# Patient Record
Sex: Female | Born: 1995 | Race: Black or African American | Hispanic: No | Marital: Single | Smoking: Never smoker
Health system: Southern US, Community
[De-identification: ages and names within clinical notes are randomized; demographics above are authoritative.]

## PROBLEM LIST (undated history)

## (undated) DIAGNOSIS — T7840XA Allergy, unspecified, initial encounter: Secondary | ICD-10-CM

## (undated) DIAGNOSIS — D649 Anemia, unspecified: Secondary | ICD-10-CM

## (undated) DIAGNOSIS — F32A Depression, unspecified: Secondary | ICD-10-CM

## (undated) DIAGNOSIS — F419 Anxiety disorder, unspecified: Secondary | ICD-10-CM

## (undated) HISTORY — DX: Depression, unspecified: F32.A

## (undated) HISTORY — DX: Anxiety disorder, unspecified: F41.9

## (undated) HISTORY — DX: Anemia, unspecified: D64.9

## (undated) HISTORY — DX: Allergy, unspecified, initial encounter: T78.40XA

---

## 2017-01-12 ENCOUNTER — Encounter: Payer: Self-pay | Admitting: Physician Assistant

## 2017-01-12 ENCOUNTER — Ambulatory Visit (INDEPENDENT_AMBULATORY_CARE_PROVIDER_SITE_OTHER): Payer: 59 | Admitting: Physician Assistant

## 2017-01-12 VITALS — BP 105/73 | HR 88 | Temp 98.9°F | Resp 17 | Ht 66.5 in | Wt 157.0 lb

## 2017-01-12 DIAGNOSIS — F909 Attention-deficit hyperactivity disorder, unspecified type: Secondary | ICD-10-CM

## 2017-01-12 DIAGNOSIS — Z0289 Encounter for other administrative examinations: Secondary | ICD-10-CM | POA: Diagnosis not present

## 2017-01-12 NOTE — Progress Notes (Signed)
   Aubery Date  MRN: [id number] DOB: Feb 11, 1996  PCP: No PCP Per Patient  Subjective:  Pt is a 21 year old female who presents to clinic for completion of forms. 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 Her PCPis in [place]. She also sees a behavioral health, Dr Saint Lucia Vizarra-Villongco in [place]. She is here today for a medical form to be filled out to allow her to bring her emotional support dog with her on an airplane. She has had this form filled out previously by her PCP, however she cannot make it to [place] for this form to be filled out. She reports she is lonely and her dog makes her feel better.  H/o ADHD - takes Adderall  qd. Controlled.  Denies SI, [place].   Review of Systems  Psychiatric/Behavioral: Positive for decreased concentration and dysphoric mood. Negative for agitation, behavioral problems, self-injury and suicidal ideas. The patient is not nervous/anxious.     There are no active problems to display for this patient.   No current outpatient prescriptions on file prior to visit.   No current facility-administered medications on file prior to visit.     No Known Allergies   Objective:  BP 105/73 (BP Location: Right Arm, Patient Position: Sitting, Cuff Size: Normal)   Pulse 88   Temp 98.9 F (37.2 C) (Oral)   Resp 17   Ht 5' 6.5" (1.689 m)   Wt 157 lb (71.2 kg)   LMP 01/11/2017   SpO2 100%   BMI 24.96 kg/m   Physical Exam  Constitutional: She is oriented to person, place, and time and well-developed, well-nourished, and in no distress. No distress.  Cardiovascular: Normal rate, regular rhythm and normal heart sounds.   Neurological: She is alert and oriented to person, place, and time. GCS score is 15.  Skin: Skin is warm and dry.  Psychiatric: Mood, memory, affect and judgment normal.  Vitals reviewed.   Assessment and Plan :  1. Encounter for completion of form with patient 2. Attention deficit hyperactivity disorder  (ADHD), unspecified ADHD type - This is a new pt here for forms to be filled out to allow her an emotional support animal on an airplane. Forms filled out for pt. Advised pt to have her medical records faxed to PCP. Or have her PCP fill them out next time, as this is who treats her ADHD.  She understands and agrees.    Marco Collie, PA-C  Primary Care at [facility] 01/12/2017 2:03 PM

## 2017-01-12 NOTE — Patient Instructions (Addendum)
Thank you for coming in today. I hope you feel we met your needs.  Feel free to call [facility] if you have any questions or further requests.  Please consider signing up for MyChart if you do not already have it, as this is a great way to communicate with me.  Best,  Whitney McVey, PA-C     IF you received an x-ray today, you will receive an invoice from [facility]. Please contact [facility] at [phone] with questions or concerns regarding your invoice.   IF you received labwork today, you will receive an invoice from [place]. Please contact [facility] at [phone] with questions or concerns regarding your invoice.   Our billing staff will not be able to assist you with questions regarding bills from these companies.  You will be contacted with the lab results as soon as they are available. The fastest way to get your results is to activate your My Chart account. Instructions are located on the last page of this paperwork. If you have not heard from [place] regarding the results in 2 weeks, please contact this office.

## 2017-12-21 ENCOUNTER — Encounter: Payer: Self-pay | Admitting: Physician Assistant

## 2017-12-21 ENCOUNTER — Ambulatory Visit: Payer: No Typology Code available for payment source | Admitting: Physician Assistant

## 2017-12-21 VITALS — BP 102/68 | HR 86 | Temp 98.1°F | Resp 17 | Ht 66.5 in | Wt 146.0 lb

## 2017-12-21 DIAGNOSIS — Z113 Encounter for screening for infections with a predominantly sexual mode of transmission: Secondary | ICD-10-CM

## 2017-12-21 DIAGNOSIS — J029 Acute pharyngitis, unspecified: Secondary | ICD-10-CM

## 2017-12-21 LAB — POCT RAPID STREP A (OFFICE): Rapid Strep A Screen: NEGATIVE

## 2017-12-21 NOTE — Progress Notes (Signed)
Sue Allen  MRN: [id number] DOB: [birth year]  PCP: Patient, No Pcp Per  Subjective:  Pt is a 22 year old female who presents to clinic for sore throat x 3 days. She went to [facility], tested negative for flu and rapid strep. She was tested for gonorrhea of the throat.  "I am very sexually active".    Also c/o cough, sneezing and runny nose. She had a fever of 102 yesterday.  Today she feels better as she has been taking ibuprofen.   She would like routine screening for STD's. Denies vaginal or urinary symptoms, back pain, abdomdinal pain.   Review of Systems  Constitutional: Positive for fever. Negative for chills, diaphoresis and fatigue.  HENT: Positive for sneezing and sore throat. Negative for congestion, postnasal drip, rhinorrhea, sinus pressure and sinus pain.   Respiratory: Positive for cough. Negative for shortness of breath and wheezing.   Cardiovascular: Negative for chest pain and palpitations.  Gastrointestinal: Negative for abdominal pain, diarrhea, nausea and vomiting.  Genitourinary: Negative for decreased urine volume, difficulty urinating, dysuria, enuresis, flank pain, frequency, hematuria, urgency and vaginal discharge.  Musculoskeletal: Negative for back pain.  Neurological: Negative for dizziness, weakness, light-headedness and headaches.  Psychiatric/Behavioral: Negative for sleep disturbance.    There are no active problems to display for this patient.   Current Outpatient Medications on File Prior to Visit  Medication Sig Dispense Refill  . amphetamine-dextroamphetamine (ADDERALL XR) 20 MG 24 hr capsule Take 20 mg by mouth daily.    Marland Kitchen. ibuprofen (ADVIL,MOTRIN) 800 MG tablet Take 800 mg by mouth every 8 (eight) hours as needed.     No current facility-administered medications on file prior to visit.     No Known Allergies   Objective:  BP 102/68   Pulse 86   Temp 98.1 F (36.7 C) (Oral)   Resp 17   Ht 5' 6.5" (1.689 m)   Wt  146 lb (66.2 kg)   LMP 12/08/2017 (Approximate)   SpO2 98%   BMI 23.21 kg/m   Physical Exam  Constitutional: She is oriented to person, place, and time and well-developed, well-nourished, and in no distress. No distress.  HENT:  Right Ear: Tympanic membrane normal.  Left Ear: Tympanic membrane normal.  Nose: Mucosal edema present. No rhinorrhea. Right sinus exhibits no maxillary sinus tenderness and no frontal sinus tenderness. Left sinus exhibits no maxillary sinus tenderness and no frontal sinus tenderness.  Mouth/Throat: Mucous membranes are normal. Oropharyngeal exudate present. No posterior oropharyngeal edema or posterior oropharyngeal erythema.  Cardiovascular: Normal rate, regular rhythm and normal heart sounds.  Pulmonary/Chest: Effort normal and breath sounds normal. No respiratory distress. She has no wheezes. She has no rales.  Neurological: She is alert and oriented to person, place, and time. GCS score is 15.  Skin: Skin is warm and dry.  Psychiatric: Mood, memory, affect and judgment normal.  Vitals reviewed.  Results for orders placed or performed in visit on 12/21/17  POCT rapid strep A  Result Value Ref Range   Rapid Strep A Screen Negative Negative    Assessment and Plan :  1. Sore throat - POCT rapid strep A - Culture, Group A Strep - pt presents for sore throat x 3 days. Vitals are stable. Recently seen at [employer]. Rapid strep is negative today. Culture is pending. Will contact with results.  2. Screen for STD (sexually transmitted disease) - Chlamydia/Gonococcus/Trichomonas, NAA; Future - HIV antibody - RPR - HSV(herpes simplex vrs)  1+2 ab-IgG - She requests routine STD screening. She is asymptomatic.   Marco Collie, PA-C  Primary Care at [facility] 12/21/2017 9:24 AM

## 2017-12-21 NOTE — Patient Instructions (Addendum)
Your rapid strep is negative. We will contact you with the results of your strep culture.  Please come back to provide urine sample for STD screening. DO NOT URINATE FOR AT LEAST 2 hours prior to your sample.   Antibiotics Aren't Always The Answer  CDC Urges Public To Be Antibiotics Aware The Centers for Disease Control and Prevention (CDC) encourages patients and families to Be Antibiotics Aware by learning about safe antibiotic use. Each year in the [place], at least 2 million people get infected with antibiotic-resistant bacteria. At least 23,000 die as a result. Antibiotic resistance, one of the most urgent threats to the public's health, occurs when bacteria develop the ability to defeat the drugs designed to kill them.  What Do Antibiotics Treat? When you get a prescription for antibiotics, follow your doctor's instructions carefully.  Antibiotics are critical tools for treating a number of common infections, such as pneumonia, and for life-threatening conditions including sepsis. Antibiotics are only needed for treating certain infections caused by bacteria.  What Don't Antibiotics Treat? Antibiotics do not work on viruses, such as colds and flu, or runny noses, even if the mucus is thick, yellow or green. Antibiotics also won't help some common bacterial infections including most cases of bronchitis, many sinus infections, and some ear infections.  What Are The Side Effects of Antibiotics? Any time antibiotics are used, they can cause side effects and lead to antibiotic resistance. When antibiotics aren't needed, they won't help you, and the side effects could still hurt you. Common side effects range from things like rashes and yeast infections to severe health problems. More serious side effects include Clostridium difficile infection (also called C. difficile or C. diff), which causes diarrhea that can lead to severe colon damage and death. If you need antibiotics, take them  exactly as prescribed. Patients and families can talk to their healthcare professional if they have any questions about their antibiotics, or if they develop side effects, especially diarrhea, since that could be C. difficile, which needs to be treated.  Can I Feel Better Without Antibiotics? Patients and families can ask their healthcare professional about the best way to feel better while their body fights off the virus. Respiratory viruses usually go away in a week or two without treatment.  How Can I Stay Healthy? Regular hand-washing can go a long way toward protecting you from germs.  We can all stay healthy and keep others healthy by cleaning our hands, covering our coughs, staying home when sick, and getting recommended vaccines, for the flu, for example. Antibiotics save lives. When a patient needs antibiotics, the benefits outweigh the risks of side effects and antibiotic resistance. Improving the way we take antibiotics helps keep us healthy now, helps fight antibiotic resistance, and ensures that life-saving antibiotics will be available for future generations.  To learn more about antibiotic prescribing and use, visit http://www.mitchell-miller.com/www.cdc.gov/antibiotic-use.   IF you received an x-ray today, you will receive an invoice from [facility]. Please contact [facility] at [phone] with questions or concerns regarding your invoice.   IF you received labwork today, you will receive an invoice from [facility]. Please contact [facility] at [phone] with questions or concerns regarding your invoice.   Our billing staff will not be able to assist you with questions regarding bills from these companies.  You will be contacted with the lab results as soon as they are available. The fastest way to get your results is to activate your My Chart account. Instructions are located  on the last page of this paperwork. If you have not heard from [place] regarding the results in 2 weeks, please contact  this office.

## 2017-12-22 LAB — HSV(HERPES SIMPLEX VRS) I + II AB-IGG
HSV 1 Glycoprotein G Ab, IgG: <0.91 {index} (ref 0.00–0.90)
HSV 2 IgG, Type Spec: <0.91 index (ref 0.00–0.90)

## 2017-12-22 LAB — HIV ANTIBODY (ROUTINE TESTING W REFLEX): HIV Screen 4th Generation wRfx: NONREACTIVE

## 2017-12-22 LAB — RPR: RPR Ser Ql: NONREACTIVE

## 2017-12-23 LAB — CULTURE, GROUP A STREP: Strep A Culture: NEGATIVE

## 2017-12-28 NOTE — Progress Notes (Signed)
I am still waiting for her GC/chlamydia, which reads "expected". Can you please check on this?  Also, please call her and let her know she is negative for HIV, syphilis, and herpes. Thank you!

## 2018-02-16 ENCOUNTER — Encounter: Payer: No Typology Code available for payment source | Admitting: Physician Assistant

## 2018-03-27 ENCOUNTER — Encounter: Payer: Self-pay | Admitting: Physician Assistant

## 2018-03-27 ENCOUNTER — Ambulatory Visit (INDEPENDENT_AMBULATORY_CARE_PROVIDER_SITE_OTHER): Payer: No Typology Code available for payment source | Admitting: Physician Assistant

## 2018-03-27 ENCOUNTER — Other Ambulatory Visit: Payer: Self-pay

## 2018-03-27 VITALS — BP 110/70 | HR 78 | Temp 98.8°F | Resp 16 | Ht 66.0 in | Wt 157.0 lb

## 2018-03-27 DIAGNOSIS — Z3202 Encounter for pregnancy test, result negative: Secondary | ICD-10-CM | POA: Diagnosis not present

## 2018-03-27 DIAGNOSIS — Z1329 Encounter for screening for other suspected endocrine disorder: Secondary | ICD-10-CM | POA: Diagnosis not present

## 2018-03-27 DIAGNOSIS — Z124 Encounter for screening for malignant neoplasm of cervix: Secondary | ICD-10-CM

## 2018-03-27 DIAGNOSIS — N920 Excessive and frequent menstruation with regular cycle: Secondary | ICD-10-CM | POA: Diagnosis not present

## 2018-03-27 DIAGNOSIS — Z13228 Encounter for screening for other metabolic disorders: Secondary | ICD-10-CM

## 2018-03-27 DIAGNOSIS — D509 Iron deficiency anemia, unspecified: Secondary | ICD-10-CM

## 2018-03-27 DIAGNOSIS — Z01419 Encounter for gynecological examination (general) (routine) without abnormal findings: Secondary | ICD-10-CM | POA: Diagnosis not present

## 2018-03-27 DIAGNOSIS — Z130 Encounter for screening for diseases of the blood and blood-forming organs and certain disorders involving the immune mechanism: Secondary | ICD-10-CM

## 2018-03-27 DIAGNOSIS — Z0000 Encounter for general adult medical examination without abnormal findings: Secondary | ICD-10-CM

## 2018-03-27 LAB — POCT URINE PREGNANCY: Preg Test, Ur: NEGATIVE

## 2018-03-27 NOTE — Progress Notes (Signed)
Primary Care at [place], [place] [address] [phone]  Date:  03/27/2018   Name:  Sue Allen   DOB:  24-Aug-1996   MRN:  [id number]  PCP:  Patient, No Pcp Per    Chief Complaint: Annual Exam   History of Present Illness:  This is a 22 y.o. female with PMH anemia who is presenting for CPE. She is at [employer] studying [employer].  She works as a Best boy.   Complaints: AUB. LMP: 03/16/2018. She's been on her period x 2 weeks. She was off birth control x 6 months, then started back on it last month.  Contraception: ocp's. She is interested in IUD.  Last pap: never Sexual history: sexually active with men and women.  Immunizations: needs tdap Dentist: last check-up was last year Eye: 20/30 b/l  Diet/Exercise: works as a Best boy. "super socially stressful".  Fam hx: family history includes Alzheimer's disease in her maternal grandmother; Diabetes in her maternal grandmother and mother; Glaucoma in her maternal grandmother; Hyperlipidemia in her father and mother; Hypertension in her mother.   Review of Systems:  Review of Systems  Constitutional: Negative for chills, diaphoresis, fatigue and fever.  HENT: Negative for congestion, postnasal drip, rhinorrhea, sinus pressure, sneezing and sore throat.   Respiratory: Negative for cough, chest tightness, shortness of breath and wheezing.   Cardiovascular: Negative for chest pain and palpitations.  Gastrointestinal: Negative for abdominal pain, diarrhea, nausea and vomiting.  Genitourinary: Positive for menstrual problem and vaginal bleeding. Negative for decreased urine volume, difficulty urinating, dysuria, enuresis, flank pain, frequency, hematuria, urgency, vaginal discharge and vaginal pain.  Musculoskeletal: Negative for back pain.  Neurological: Negative for dizziness, weakness, light-headedness and headaches.    There are no active problems to display for this patient.   Prior to Admission medications    Medication Sig Start Date End Date Taking? Authorizing Provider  amphetamine-dextroamphetamine (ADDERALL XR) 20 MG 24 hr capsule Take 20 mg by mouth daily.   Yes [provider]    No Known Allergies  Social History   Tobacco Use  . Smoking status: Never Smoker  . Smokeless tobacco: Never Used  Substance Use Topics  . Alcohol use: Not Currently    Alcohol/week: 0.6 oz    Types: 1 Standard drinks or equivalent per week  . Drug use: Not Currently    Types: Marijuana    Family History  Problem Relation Age of Onset  . Diabetes Mother   . Hyperlipidemia Mother   . Hypertension Mother   . Hyperlipidemia Father   . Diabetes Maternal Grandmother   . Alzheimer's disease Maternal Grandmother   . Glaucoma Maternal Grandmother     Medication list has been reviewed and updated.  Physical Examination:  Physical Exam  Constitutional: She is oriented to person, place, and time. She appears well-developed and well-nourished. No distress.  HENT:  Head: Normocephalic and atraumatic.  Mouth/Throat: Oropharynx is clear and moist.  Eyes: Pupils are equal, round, and reactive to light. Conjunctivae and EOM are normal.  Neck: Normal range of motion. Neck supple. No thyromegaly present.  Cardiovascular: Normal rate, regular rhythm and normal heart sounds.  No murmur heard. Pulmonary/Chest: Effort normal and breath sounds normal. She has no wheezes.  Genitourinary: Cervix exhibits no motion tenderness and no discharge. Right adnexum displays no mass and no tenderness. Left adnexum displays no mass and no tenderness. There is bleeding in the vagina. No vaginal discharge found.  Musculoskeletal: Normal range of motion.  Neurological:  She is alert and oriented to person, place, and time. She has normal reflexes.  Skin: Skin is warm and dry.  Psychiatric: She has a normal mood and affect. Her behavior is normal. Judgment and thought content normal.  Vitals reviewed.  BP 110/70 (BP  Location: Left Arm, Patient Position: Sitting, Cuff Size: Normal)   Pulse 78   Temp 98.8 F (37.1 C) (Oral)   Resp 16   Ht 5' 6" (1.676 m)   Wt 157 lb (71.2 kg)   LMP 03/16/2018   SpO2 100%   BMI 25.34 kg/m   Assessment and Plan: 1. Annual physical exam - Pt presents for annual exam. PAP done today. con't ocp's x 3 months. She will consider IUD with Dr. Nolon Rod. Sexual healthy and safety discussed. Routine health maintenance discussed. Will check anemia labs. Routine labs are pending. Will contact with results.   2. Iron deficiency anemia, unspecified iron deficiency anemia type - Iron, TIBC and Ferritin Panel  3. Screening for cervical cancer - Pap IG w/ reflex to HPV when ASC-U  4. Screening for endocrine, metabolic and immunity disorder - POCT urinalysis dipstick - CBC with Differential/Platelet - Lipid panel - CMP14+EGFR  5. Spotting - POCT urine pregnancy   Mercer Pod, PA-C  Primary Care at [place] 03/27/2018 12:26 PM

## 2018-03-27 NOTE — Patient Instructions (Addendum)
Make an appointment with Dr. Nolon Rod for IUD  We will contact you with today's lab results.   Health Maintenance, Female Adopting a healthy lifestyle and getting preventive care can go a long way to promote health and wellness. Talk with your health care provider about what schedule of regular examinations is right for you. This is a good chance for you to check in with your provider about disease prevention and staying healthy. In between checkups, there are plenty of things you can do on your own. Experts have done a lot of research about which lifestyle changes and preventive measures are most likely to keep you healthy. Ask your health care provider for more information. Weight and diet Eat a healthy diet  Be sure to include plenty of vegetables, fruits, low-fat dairy products, and lean protein.  Do not eat a lot of foods high in solid fats, added sugars, or salt.  Get regular exercise. This is one of the most important things you can do for your health. ? Most adults should exercise for at least 150 minutes each week. The exercise should increase your heart rate and make you sweat (moderate-intensity exercise). ? Most adults should also do strengthening exercises at least twice a week. This is in addition to the moderate-intensity exercise.  Maintain a healthy weight  Body mass index (BMI) is a measurement that can be used to identify possible weight problems. It estimates body fat based on height and weight. Your health care provider can help determine your BMI and help you achieve or maintain a healthy weight.  For females 2 years of age and older: ? A BMI below 18.5 is considered underweight. ? A BMI of 18.5 to 24.9 is normal. ? A BMI of 25 to 29.9 is considered overweight. ? A BMI of 30 and above is considered obese.  Watch levels of cholesterol and blood lipids  You should start having your blood tested for lipids and cholesterol at 23 years of age, then have this test  every 5 years.  You may need to have your cholesterol levels checked more often if: ? Your lipid or cholesterol levels are high. ? You are older than 23 years of age. ? You are at high risk for heart disease.  Cancer screening Lung Cancer  Lung cancer screening is recommended for adults 9-34 years old who are at high risk for lung cancer because of a history of smoking.  A yearly low-dose CT scan of the lungs is recommended for people who: ? Currently smoke. ? Have quit within the past 15 years. ? Have at least a 30-pack-year history of smoking. A pack year is smoking an average of one pack of cigarettes a day for 1 year.  Yearly screening should continue until it has been 15 years since you quit.  Yearly screening should stop if you develop a health problem that would prevent you from having lung cancer treatment.  Breast Cancer  Practice breast self-awareness. This means understanding how your breasts normally appear and feel.  It also means doing regular breast self-exams. Let your health care provider know about any changes, no matter how small.  If you are in your 20s or 30s, you should have a clinical breast exam (CBE) by a health care provider every 1-3 years as part of a regular health exam.  If you are 106 or older, have a CBE every year. Also consider having a breast X-ray (mammogram) every year.  If you have a family history  of breast cancer, talk to your health care provider about genetic screening.  If you are at high risk for breast cancer, talk to your health care provider about having an MRI and a mammogram every year.  Breast cancer gene (BRCA) assessment is recommended for women who have family members with BRCA-related cancers. BRCA-related cancers include: ? Breast. ? Ovarian. ? Tubal. ? Peritoneal cancers.  Results of the assessment will determine the need for genetic counseling and BRCA1 and BRCA2 testing.  Cervical Cancer Your health care provider  may recommend that you be screened regularly for cancer of the pelvic organs (ovaries, uterus, and vagina). This screening involves a pelvic examination, including checking for microscopic changes to the surface of your cervix (Pap test). You may be encouraged to have this screening done every 3 years, beginning at age 57.  For women ages 28-65, health care providers may recommend pelvic exams and Pap testing every 3 years, or they may recommend the Pap and pelvic exam, combined with testing for human papilloma virus (HPV), every 5 years. Some types of HPV increase your risk of cervical cancer. Testing for HPV may also be done on women of any age with unclear Pap test results.  Other health care providers may not recommend any screening for nonpregnant women who are considered low risk for pelvic cancer and who do not have symptoms. Ask your health care provider if a screening pelvic exam is right for you.  If you have had past treatment for cervical cancer or a condition that could lead to cancer, you need Pap tests and screening for cancer for at least 20 years after your treatment. If Pap tests have been discontinued, your risk factors (such as having a new sexual partner) need to be reassessed to determine if screening should resume. Some women have medical problems that increase the chance of getting cervical cancer. In these cases, your health care provider may recommend more frequent screening and Pap tests.  Colorectal Cancer  This type of cancer can be detected and often prevented.  Routine colorectal cancer screening usually begins at 23 years of age and continues through 23 years of age.  Your health care provider may recommend screening at an earlier age if you have risk factors for colon cancer.  Your health care provider may also recommend using home test kits to check for hidden blood in the stool.  A small camera at the end of a tube can be used to examine your colon directly  (sigmoidoscopy or colonoscopy). This is done to check for the earliest forms of colorectal cancer.  Routine screening usually begins at age 71.  Direct examination of the colon should be repeated every 5-10 years through 23 years of age. However, you may need to be screened more often if early forms of precancerous polyps or small growths are found.  Skin Cancer  Check your skin from head to toe regularly.  Tell your health care provider about any new moles or changes in moles, especially if there is a change in a mole's shape or color.  Also tell your health care provider if you have a mole that is larger than the size of a pencil eraser.  Always use sunscreen. Apply sunscreen liberally and repeatedly throughout the day.  Protect yourself by wearing long sleeves, pants, a wide-brimmed hat, and sunglasses whenever you are outside.  Heart disease, diabetes, and high blood pressure  High blood pressure causes heart disease and increases the risk of stroke. High  blood pressure is more likely to develop in: ? People who have blood pressure in the high end of the normal range (130-139/85-89 mm Hg). ? People who are overweight or obese. ? People who are African American.  If you are 18-39 years of age, have your blood pressure checked every 3-5 years. If you are 40 years of age or older, have your blood pressure checked every year. You should have your blood pressure measured twice-once when you are at a hospital or clinic, and once when you are not at a hospital or clinic. Record the average of the two measurements. To check your blood pressure when you are not at a hospital or clinic, you can use: ? An automated blood pressure machine at a pharmacy. ? A home blood pressure monitor.  If you are between 55 years and 79 years old, ask your health care provider if you should take aspirin to prevent strokes.  Have regular diabetes screenings. This involves taking a blood sample to check your  fasting blood sugar level. ? If you are at a normal weight and have a low risk for diabetes, have this test once every three years after 23 years of age. ? If you are overweight and have a high risk for diabetes, consider being tested at a younger age or more often. Preventing infection Hepatitis B  If you have a higher risk for hepatitis B, you should be screened for this virus. You are considered at high risk for hepatitis B if: ? You were born in a country where hepatitis B is common. Ask your health care provider which countries are considered high risk. ? Your parents were born in a high-risk country, and you have not been immunized against hepatitis B (hepatitis B vaccine). ? You have HIV or AIDS. ? You use needles to inject street drugs. ? You live with someone who has hepatitis B. ? You have had sex with someone who has hepatitis B. ? You get hemodialysis treatment. ? You take certain medicines for conditions, including cancer, organ transplantation, and autoimmune conditions.  Hepatitis C  Blood testing is recommended for: ? Everyone born from 1945 through 1965. ? Anyone with known risk factors for hepatitis C.  Sexually transmitted infections (STIs)  You should be screened for sexually transmitted infections (STIs) including gonorrhea and chlamydia if: ? You are sexually active and are younger than 24 years of age. ? You are older than 24 years of age and your health care provider tells you that you are at risk for this type of infection. ? Your sexual activity has changed since you were last screened and you are at an increased risk for chlamydia or gonorrhea. Ask your health care provider if you are at risk.  If you do not have HIV, but are at risk, it may be recommended that you take a prescription medicine daily to prevent HIV infection. This is called pre-exposure prophylaxis (PrEP). You are considered at risk if: ? You are sexually active and do not regularly use condoms  or know the HIV status of your partner(s). ? You take drugs by injection. ? You are sexually active with a partner who has HIV.  Talk with your health care provider about whether you are at high risk of being infected with HIV. If you choose to begin PrEP, you should first be tested for HIV. You should then be tested every 3 months for as long as you are taking PrEP. Pregnancy  If you are   premenopausal and you may become pregnant, ask your health care provider about preconception counseling.  If you may become pregnant, take 400 to 800 micrograms (mcg) of folic acid every day.  If you want to prevent pregnancy, talk to your health care provider about birth control (contraception). Osteoporosis and menopause  Osteoporosis is a disease in which the bones lose minerals and strength with aging. This can result in serious bone fractures. Your risk for osteoporosis can be identified using a bone density scan.  If you are 31 years of age or older, or if you are at risk for osteoporosis and fractures, ask your health care provider if you should be screened.  Ask your health care provider whether you should take a calcium or vitamin D supplement to lower your risk for osteoporosis.  Menopause may have certain physical symptoms and risks.  Hormone replacement therapy may reduce some of these symptoms and risks. Talk to your health care provider about whether hormone replacement therapy is right for you. Follow these instructions at home:  Schedule regular health, dental, and eye exams.  Stay current with your immunizations.  Do not use any tobacco products including cigarettes, chewing tobacco, or electronic cigarettes.  If you are pregnant, do not drink alcohol.  If you are breastfeeding, limit how much and how often you drink alcohol.  Limit alcohol intake to no more than 1 drink per day for nonpregnant women. One drink equals 12 ounces of beer, 5 ounces of wine, or 1 ounces of hard  liquor.  Do not use street drugs.  Do not share needles.  Ask your health care provider for help if you need support or information about quitting drugs.  Tell your health care provider if you often feel depressed.  Tell your health care provider if you have ever been abused or do not feel safe at home. This information is not intended to replace advice given to you by your health care provider. Make sure you discuss any questions you have with your health care provider. Document Released: 03/20/2011 Document Revised: 02/10/2016 Document Reviewed: 06/08/2015 Elsevier Interactive Patient Education  [employer].  Thank you for coming in today. I hope you feel we met your needs.  Feel free to call PCP if you have any questions or further requests.  Please consider signing up for MyChart if you do not already have it, as this is a great way to communicate with me.  Best,  Whitney McVey, PA-C  IF you received an x-ray today, you will receive an invoice from [facility]. Please contact [facility] at [phone] with questions or concerns regarding your invoice.   IF you received labwork today, you will receive an invoice from [facility]. Please contact [facility] at [phone] with questions or concerns regarding your invoice.   Our billing staff will not be able to assist you with questions regarding bills from these companies.  You will be contacted with the lab results as soon as they are available. The fastest way to get your results is to activate your My Chart account. Instructions are located on the last page of this paperwork. If you have not heard from [place] regarding the results in 2 weeks, please contact this office.

## 2018-03-28 LAB — CBC WITH DIFFERENTIAL/PLATELET
Basophils Absolute: 0.1 10*3/uL (ref 0.0–0.2)
Basos: 2 %
EOS (ABSOLUTE): 0.2 10*3/uL (ref 0.0–0.4)
Eos: 3 %
Hematocrit: 32 % — ABNORMAL LOW (ref 34.0–46.6)
Hemoglobin: 10.1 g/dL — ABNORMAL LOW (ref 11.1–15.9)
Lymphocytes Absolute: 2.4 10*3/uL (ref 0.7–3.1)
Lymphs: 37 %
MCH: 25.3 pg — ABNORMAL LOW (ref 26.6–33.0)
MCHC: 31.6 g/dL (ref 31.5–35.7)
MCV: 80 fL (ref 79–97)
Monocytes Absolute: 0.6 10*3/uL (ref 0.1–0.9)
Monocytes: 9 %
Neutrophils Absolute: 3.1 10*3/uL (ref 1.4–7.0)
Neutrophils: 49 %
Platelets: 259 10*3/uL (ref 150–450)
RBC: 3.99 x10E6/uL (ref 3.77–5.28)
RDW: 13.9 % (ref 12.3–15.4)
WBC: 6.4 10*3/uL (ref 3.4–10.8)

## 2018-03-28 LAB — CMP14+EGFR
ALT: 10 IU/L (ref 0–32)
AST: 15 IU/L (ref 0–40)
Albumin/Globulin Ratio: 1.3 (ref 1.2–2.2)
Albumin: 4 g/dL (ref 3.5–5.5)
Alkaline Phosphatase: 57 IU/L (ref 39–117)
BUN/Creatinine Ratio: 17 (ref 9–23)
BUN: 12 mg/dL (ref 6–20)
Bilirubin Total: <0.2 mg/dL (ref 0.0–1.2)
CO2: 23 mmol/L (ref 20–29)
Calcium: 9.2 mg/dL (ref 8.7–10.2)
Chloride: 104 mmol/L (ref 96–106)
Creatinine, Ser: 0.72 mg/dL (ref 0.57–1.00)
GFR calc Af Amer: 137 mL/min/{1.73_m2} (ref >59)
GFR calc non Af Amer: 119 mL/min/{1.73_m2} (ref >59)
Globulin, Total: 3 g/dL (ref 1.5–4.5)
Glucose: 70 mg/dL (ref 65–99)
Potassium: 4.4 mmol/L (ref 3.5–5.2)
Sodium: 140 mmol/L (ref 134–144)
Total Protein: 7 g/dL (ref 6.0–8.5)

## 2018-03-28 LAB — IRON,TIBC AND FERRITIN PANEL
Ferritin: 12 ng/mL — ABNORMAL LOW (ref 15–150)
Iron Saturation: 10 % — ABNORMAL LOW (ref 15–55)
Iron: 41 ug/dL (ref 27–159)
Total Iron Binding Capacity: 403 ug/dL (ref 250–450)
UIBC: 362 ug/dL (ref 131–425)

## 2018-03-28 LAB — LIPID PANEL
Chol/HDL Ratio: 2.5 ratio (ref 0.0–4.4)
Cholesterol, Total: 138 mg/dL (ref 100–199)
HDL: 55 mg/dL (ref >39)
LDL Calculated: 67 mg/dL (ref 0–99)
Triglycerides: 79 mg/dL (ref 0–149)
VLDL Cholesterol Cal: 16 mg/dL (ref 5–40)

## 2018-03-29 LAB — PAP IG W/ RFLX HPV ASCU: PAP Smear Comment: 0

## 2018-04-02 ENCOUNTER — Encounter: Payer: Self-pay | Admitting: Physician Assistant

## 2018-04-02 DIAGNOSIS — D509 Iron deficiency anemia, unspecified: Secondary | ICD-10-CM

## 2018-04-02 MED ORDER — FERROUS SULFATE 325 (65 FE) MG PO TABS: 325.0000 mg | ORAL_TABLET | ORAL | 3 refills | Status: DC

## 2018-04-02 NOTE — Progress Notes (Signed)
Please call pt and let her know: You are low on iron. Start an iron supplement once a day - I sent in Rx for ferrous sulfate 324mg  to her pharmacy. Take this on an empty stomach 30 minutes before eating or 2 hours after a meal. Take this on Monday, Wednesday and Friday. Take it with a vitamin C supplement or a small glass of orange juice.  I sent a letter in the mail with results and instructions.  Come back in 6 weeks for recheck.  PAP is negative -- next PAP recommended in 3 years.   Thank you!

## 2018-05-01 ENCOUNTER — Telehealth: Payer: Self-pay | Admitting: Physician Assistant

## 2018-05-01 NOTE — Telephone Encounter (Signed)
Copied from CRM [phone]. Topic: General - Other >> May 01, 2018  3:38 PM Tamela OddiHarris, Brenda J wrote: Reason for CRM: Patient called to inform doctor that she would like to restart her birth control pills.  Patient wanted to know what she will have to do to start the process.  CB# [phone]

## 2018-05-02 NOTE — Telephone Encounter (Signed)
Patient had a physical 03/2018.

## 2018-05-06 NOTE — Telephone Encounter (Signed)
Needs to have a negative pregnancy test before starting birth control. Pt needs to come back in early-to-mid Sept to recheck her iron levels anyway, will do this at the same time.

## 2018-05-07 NOTE — Telephone Encounter (Signed)
Left detailed message.   

## 2018-05-23 ENCOUNTER — Other Ambulatory Visit: Payer: Self-pay

## 2018-05-23 ENCOUNTER — Ambulatory Visit (INDEPENDENT_AMBULATORY_CARE_PROVIDER_SITE_OTHER): Payer: No Typology Code available for payment source | Admitting: Physician Assistant

## 2018-05-23 ENCOUNTER — Encounter: Payer: Self-pay | Admitting: Physician Assistant

## 2018-05-23 VITALS — BP 105/70 | HR 87 | Temp 99.0°F | Resp 18 | Ht 66.0 in | Wt 154.6 lb

## 2018-05-23 DIAGNOSIS — D509 Iron deficiency anemia, unspecified: Secondary | ICD-10-CM

## 2018-05-23 DIAGNOSIS — Z3041 Encounter for surveillance of contraceptive pills: Secondary | ICD-10-CM | POA: Diagnosis not present

## 2018-05-23 LAB — POCT URINE PREGNANCY: Preg Test, Ur: NEGATIVE

## 2018-05-23 MED ORDER — LEVONORGESTREL-ETHINYL ESTRAD 0.1-20 MG-MCG PO TABS: 1.0000 | ORAL_TABLET | Freq: Every day | ORAL | 11 refills | Status: DC

## 2018-05-23 NOTE — Patient Instructions (Addendum)
See package insert for start date options for Avian birth control.  Continue using condoms with every sexual encounter.  I will contact you with the results of your iron blood work.   Ethinyl Estradiol; Levonorgestrel tablets What is this medicine? ETHINYL ESTRADIOL; LEVONORGESTREL (ETH in il es tra DYE ole; LEE voh nor jes trel) is an oral contraceptive. It combines two types of female hormones, an estrogen and a progestin. They are used to prevent ovulation and pregnancy. This medicine may be used for other purposes; ask your health care provider or pharmacist if you have questions. COMMON BRAND NAME(S): [place], [place], [place]ethia, Amethia Lo, [place], [place], [place], Aviane, Camrese, Camrese Lo, [place], [place], [address], [place], [place], [place], [place], [place], [place], [place], [place], [place], [place], [place], Levonorgestrel/Ethinyl Estradiol, [place], [place], [place], [place], [place], [place], [place], [place], [place], [place], [place], [place], [place], [place], [place], [place], [place], Debroah Baller What should I tell my health care provider before I take this medicine? They need to know if you have or ever had any of these conditions: -abnormal vaginal bleeding -blood vessel disease or blood clots -breast, cervical, endometrial, ovarian, liver, or uterine cancer -diabetes -gallbladder disease -heart disease or recent heart attack -high blood pressure -high cholesterol -kidney disease -liver disease -migraine headaches -stroke -systemic lupus erythematosus (SLE) -tobacco smoker -an unusual or allergic reaction to estrogens, progestins, other medicines, foods, dyes, or preservatives -pregnant or trying to get pregnant -breast-feeding How should I use this medicine? Take this medicine by mouth. To reduce nausea, this medicine may be taken with food. Follow the directions on the prescription label. Take this medicine at the same time each day  and in the order directed on the package. Do not take your medicine more often than directed. Contact your pediatrician regarding the use of this medicine in children. Special care may be needed. This medicine has been used in female children who have started having menstrual periods. A patient package insert for the product will be given with each prescription and refill. Read this sheet carefully each time. The sheet may change frequently. Overdosage: If you think you have taken too much of this medicine contact a poison control center or emergency room at once. NOTE: This medicine is only for you. Do not share this medicine with others. What if I miss a dose? If you miss a dose, refer to the patient information sheet you received with your medicine for direction. If you miss more than one pill, this medicine may not be as effective and you may need to use another form of birth control. What may interact with this medicine? Do not take this medicine with the following medication: -dasabuvir; ombitasvir; paritaprevir; ritonavir -ombitasvir; paritaprevir; ritonavir This medicine may also interact with the following medications: -acetaminophen -antibiotics or medicines for infections, especially rifampin, rifabutin, rifapentine, and griseofulvin, and possibly penicillins or tetracyclines -aprepitant -ascorbic acid (vitamin C) -atorvastatin -barbiturate medicines, such as phenobarbital -bosentan -carbamazepine -caffeine -clofibrate -cyclosporine -dantrolene -doxercalciferol -felbamate -grapefruit juice -hydrocortisone -medicines for anxiety or sleeping problems, such as diazepam or temazepam -medicines for diabetes, including pioglitazone -mineral oil -modafinil -mycophenolate -nefazodone -oxcarbazepine -phenytoin -prednisolone -ritonavir or other medicines for HIV infection or AIDS -rosuvastatin -selegiline -soy isoflavones supplements -St. John's wort -tamoxifen or  raloxifene -theophylline -thyroid hormones -topiramate -warfarin This list may not describe all possible interactions. Give your health care provider a list of all the medicines, herbs, non-prescription drugs, or dietary supplements you use. Also tell them if you smoke, drink alcohol, or use illegal drugs. Some items may  interact with your medicine. What should I watch for while using this medicine? Visit your doctor or health care professional for regular checks on your progress. You will need a regular breast and pelvic exam and Pap smear while on this medicine. Use an additional method of contraception during the first cycle that you take these tablets. If you have any reason to think you are pregnant, stop taking this medicine right away and contact your doctor or health care professional. If you are taking this medicine for hormone related problems, it may take several cycles of use to see improvement in your condition. Smoking increases the risk of getting a blood clot or having a stroke while you are taking birth control pills, especially if you are more than 23 years old. You are strongly advised not to smoke. This medicine can make your body retain fluid, making your fingers, hands, or ankles swell. Your blood pressure can go up. Contact your doctor or health care professional if you feel you are retaining fluid. This medicine can make you more sensitive to the sun. Keep out of the sun. If you cannot avoid being in the sun, wear protective clothing and use sunscreen. Do not use sun lamps or tanning beds/booths. If you wear contact lenses and notice visual changes, or if the lenses begin to feel uncomfortable, consult your eye care specialist. In some women, tenderness, swelling, or minor bleeding of the gums may occur. Notify your dentist if this happens. Brushing and flossing your teeth regularly may help limit this. See your dentist regularly and inform your dentist of the medicines you are  taking. If you are going to have elective surgery, you may need to stop taking this medicine before the surgery. Consult your health care professional for advice. This medicine does not protect you against HIV infection (AIDS) or any other sexually transmitted diseases. What side effects may I notice from receiving this medicine? Side effects that you should report to your doctor or health care professional as soon as possible: -breast tissue changes or discharge -changes in vaginal bleeding during your period or between your periods -chest pain -coughing up blood -dizziness or fainting spells -headaches or migraines -leg, arm or groin pain -severe or sudden headaches -stomach pain (severe) -sudden shortness of breath -sudden loss of coordination, especially on one side of the body -speech problems -symptoms of vaginal infection like itching, irritation or unusual discharge -tenderness in the upper abdomen -vomiting -weakness or numbness in the arms or legs, especially on one side of the body -yellowing of the eyes or skin Side effects that usually do not require medical attention (report to your doctor or health care professional if they continue or are bothersome): -breakthrough bleeding and spotting that continues beyond the 3 initial cycles of pills -breast tenderness -mood changes, anxiety, depression, frustration, anger, or emotional outbursts -increased sensitivity to sun or ultraviolet light -nausea -skin rash, acne, or brown spots on the skin -weight gain (slight) This list may not describe all possible side effects. Call your doctor for medical advice about side effects. You may report side effects to FDA at 1-800-FDA-1088. Where should I keep my medicine? Keep out of the reach of children. Store at room temperature between 15 and 30 degrees C (59 and 86 degrees F). Throw away any unused medicine after the expiration date. NOTE: This sheet is a summary. It may not cover  all possible information. If you have questions about this medicine, talk to your doctor, pharmacist, or health  care provider.  2018 Elsevier/Gold Standard (2016-05-15 07:58:22)  IF you received an x-ray today, you will receive an invoice from [facility]. Please contact [facility] Radiology at [phone] with questions or concerns regarding your invoice.   IF you received labwork today, you will receive an invoice from [facility]. Please contact [facility] at [phone] with questions or concerns regarding your invoice.   Our billing staff will not be able to assist you with questions regarding bills from these companies.  You will be contacted with the lab results as soon as they are available. The fastest way to get your results is to activate your My Chart account. Instructions are located on the last page of this paperwork. If you have not heard from [place] regarding the results in 2 weeks, please contact this office.

## 2018-05-23 NOTE — Progress Notes (Signed)
   Jereline Sura  MRN: [id number] DOB: 25-May-1996  PCP: Patient, No Pcp Per  Subjective:  Pt is a 22 year old female who presents to clinic for birth control.  She would like to start back on OCPs. Last dose was in July. She was considering the IUD, but decided against it.  LMP 05/03/2018 Last PAP 03/27/2018 - negative.   Iron deficiency anemia - feels more energy since taking iron supplements. Last dose was end of July. H/o heavy periods  - improved in the past with ocps.   Review of Systems  Genitourinary: Negative for menstrual problem (heavy periods), pelvic pain and vaginal discharge.  Neurological: Negative for dizziness, light-headedness and headaches.   There are no active problems to display for this patient.   Current Outpatient Medications on File Prior to Visit  Medication Sig Dispense Refill  . amphetamine-dextroamphetamine (ADDERALL XR) 20 MG 24 hr capsule Take 20 mg by mouth daily.    . ferrous sulfate 325 (65 FE) MG tablet Take 1 tablet (325 mg total) by mouth every other day. Monday, Wednesday and Friday. Take with 6 oz orange juice 30 tablet 3   No current facility-administered medications on file prior to visit.     No Known Allergies   Objective:  BP 105/70   Pulse 87   Temp 99 F (37.2 C) (Oral)   Resp 18   Ht 5\' 6"  (1.676 m)   Wt 154 lb 9.6 oz (70.1 kg)   LMP 05/03/2018   SpO2 100%   BMI 24.95 kg/m   Physical Exam  Constitutional: She is oriented to person, place, and time. No distress.  Cardiovascular: Normal rate, regular rhythm and normal heart sounds.  Neurological: She is alert and oriented to person, place, and time.  Skin: Skin is warm and dry.  Psychiatric: Judgment normal.  Vitals reviewed.  Results for orders placed or performed in visit on 05/23/18  POCT urine pregnancy  Result Value Ref Range   Preg Test, Ur Negative Negative    Assessment and Plan :  1. Encounter for surveillance of contraceptive pills - Negative urine hcg.  Okay to start ocp's.  - POCT urine pregnancy - levonorgestrel-ethinyl estradiol (AVIANE) 0.1-20 MG-MCG tablet; Take 1 tablet by mouth daily.  Dispense: 1 Package; Refill: 11  2. Iron deficiency anemia, unspecified iron deficiency anemia type - Iron, TIBC and Ferritin Panel - CBC with Differential/Platelet    Marco Collie, PA-C  Primary Care at [facility] 05/23/2018 1:47 PM  Please note: Portions of this report may have been transcribed using dragon voice recognition software. Every effort was made to ensure accuracy; however, inadvertent computerized transcription errors may be present.

## 2018-05-26 LAB — CBC WITH DIFFERENTIAL/PLATELET
Basophils Absolute: 0.1 10*3/uL (ref 0.0–0.2)
Basos: 2 %
EOS (ABSOLUTE): 0 10*3/uL (ref 0.0–0.4)
Eos: 0 %
Hematocrit: 34 % (ref 34.0–46.6)
Hemoglobin: 11 g/dL — ABNORMAL LOW (ref 11.1–15.9)
Immature Grans (Abs): 0 10*3/uL (ref 0.0–0.1)
Immature Granulocytes: 0 %
Lymphocytes Absolute: 5.2 10*3/uL — ABNORMAL HIGH (ref 0.7–3.1)
Lymphs: 58 %
MCH: 24.6 pg — ABNORMAL LOW (ref 26.6–33.0)
MCHC: 32.4 g/dL (ref 31.5–35.7)
MCV: 76 fL — ABNORMAL LOW (ref 79–97)
Monocytes Absolute: 0.5 10*3/uL (ref 0.1–0.9)
Monocytes: 6 %
Neutrophils Absolute: 3 10*3/uL (ref 1.4–7.0)
Neutrophils: 34 %
Platelets: 209 10*3/uL (ref 150–450)
RBC: 4.47 x10E6/uL (ref 3.77–5.28)
RDW: 14.5 % (ref 12.3–15.4)
WBC: 8.9 10*3/uL (ref 3.4–10.8)

## 2018-05-26 LAB — IRON,TIBC AND FERRITIN PANEL
Ferritin: 26 ng/mL (ref 15–150)
Iron Saturation: 9 % — CL (ref 15–55)
Iron: 34 ug/dL (ref 27–159)
Total Iron Binding Capacity: 371 ug/dL (ref 250–450)
UIBC: 337 ug/dL (ref 131–425)

## 2018-05-29 ENCOUNTER — Encounter: Payer: Self-pay | Admitting: Physician Assistant

## 2018-05-29 DIAGNOSIS — D509 Iron deficiency anemia, unspecified: Secondary | ICD-10-CM

## 2018-05-29 MED ORDER — FERROUS SULFATE 325 (65 FE) MG PO TABS: 325.0000 mg | ORAL_TABLET | ORAL | 3 refills | Status: DC

## 2018-05-29 NOTE — Progress Notes (Signed)
Result letter sent in mail: Your iron level continues to remain low. Start back on an iron supplement: ferrous sulfate 324mg  (65mg  of elemental iron) on Monday, Wednesday and Friday. Take this on an empty stomach 30 minutes before eating or 2 hours after a meal. Take it with a vitamin C supplement or a small glass of orange juice.  Recheck this level in 3 months.

## 2018-06-11 ENCOUNTER — Telehealth: Payer: Self-pay | Admitting: Physician Assistant

## 2018-06-11 NOTE — Telephone Encounter (Signed)
Adderall refill Last Refill:03/27/18 # ? Last OV: 05/23/18 PCP: Alphonzo LemmingsWhitney McVey [facility] [address] [place], [place]

## 2018-06-11 NOTE — Telephone Encounter (Signed)
Please advise 

## 2018-06-11 NOTE — Telephone Encounter (Signed)
Copied from CRM [phone]. Topic: General - Other >> Jun 11, 2018 10:50 AM Ronney LionArrington, Shykila A wrote: Reason for CRM: Medication: amphetamine-dextroamphetamine (ADDERALL XR) 20 MG 24 hr capsule   Has the patient contacted their pharmacy? Yes, No order has been received  (patient says she needs the medication as soon as possible, because she is in school and can not focus without this)  [facility]erred [facility]with phone number or street [facility] [address], [place] - [id number]  [phone] (Phone) [phone] (Fax)    Agent: Please be advised that RX refills may take up to 3 business days. We ask that you follow-up with your pharmacy.

## 2018-06-13 NOTE — Telephone Encounter (Signed)
I have never filled this medication for this patient.  She was asked to have medical records faxed to our office, but I do not see any forms in her media file.  She can either fax her records of ADHD assessment or I can put in a referral to [facility].  Thank you

## 2018-06-14 ENCOUNTER — Telehealth: Payer: Self-pay | Admitting: Physician Assistant

## 2018-06-14 NOTE — Telephone Encounter (Signed)
Called pt in reference to her request for a RX for Adderall. I explained her options of coming in to see McVey, having her records faxed over from her ADHD assessment or we can put in a referral. Pt understands and says that she will have her records faxed over to [place].

## 2018-07-18 ENCOUNTER — Telehealth: Payer: Self-pay | Admitting: Physician Assistant

## 2018-07-18 NOTE — Telephone Encounter (Signed)
Pt called back and spoke with Sheron she got the correct info. She faxed release off and I got the confirmation. I am going to call pt and let her know.

## 2018-07-18 NOTE — Telephone Encounter (Signed)
I spoke with pt letting her know we had done her release  the way she had filled out her paperwork-PCP to [facility]. Apparently she wanted [facility] records concerning her sent to PCP. I tried to explain this to her and she hung up on me. She is wanting [place] to give her ADHD medication with the help of the paperwork from [facility]. She hung up on me before I could fully  explain this to her.

## 2018-07-18 NOTE — Telephone Encounter (Signed)
Copied from CRM [id number]. Topic: General - Other >> Jul 18, 2018 11:04 AM Stephannie Li, NT wrote: Reason for CRM: Patient called to follow up on  release forms left at the practice front desk  last month to be faxed to [facility] for records concerning her ADHD medication please call her at  [phone],

## 2018-07-18 NOTE — Telephone Encounter (Signed)
Message sent to Medical Records Copy of release in Media Gina to research and verify release has been sent.

## 2018-07-25 ENCOUNTER — Encounter: Payer: Self-pay | Admitting: Physician Assistant

## 2018-07-25 ENCOUNTER — Other Ambulatory Visit: Payer: Self-pay

## 2018-07-25 ENCOUNTER — Ambulatory Visit (INDEPENDENT_AMBULATORY_CARE_PROVIDER_SITE_OTHER): Payer: No Typology Code available for payment source | Admitting: Physician Assistant

## 2018-07-25 VITALS — BP 106/70 | HR 79 | Temp 98.3°F | Ht 66.0 in | Wt 150.6 lb

## 2018-07-25 DIAGNOSIS — Z23 Encounter for immunization: Secondary | ICD-10-CM | POA: Diagnosis not present

## 2018-07-25 DIAGNOSIS — F411 Generalized anxiety disorder: Secondary | ICD-10-CM | POA: Diagnosis not present

## 2018-07-25 DIAGNOSIS — Z862 Personal history of diseases of the blood and blood-forming organs and certain disorders involving the immune mechanism: Secondary | ICD-10-CM | POA: Diagnosis not present

## 2018-07-25 DIAGNOSIS — R41840 Attention and concentration deficit: Secondary | ICD-10-CM

## 2018-07-25 MED ORDER — ESCITALOPRAM OXALATE 10 MG PO TABS: 10.0000 mg | ORAL_TABLET | Freq: Every day | ORAL | 1 refills | Status: DC

## 2018-07-25 NOTE — Progress Notes (Signed)
Sue Allen  MRN: [id number] DOB: 1995/10/27  PCP: Patient, No Pcp Per  Subjective:  Pt is a 22 year old female who presents to clinic for several complaints.   Endorses congestion. Denies runny nose, itchy eyes, sneezing.  vicks nasal spray daily.  H/o seasonal allergies. She tried Benadryl.   Iron def anemia- She is taking iron pills once daily. Denies SE.   adhd - She has not received medical forms from [facility] regarding her ADHD.   Anxiety and stress regarding school. She is taking 21 credit hours. "I snap at everyone." "I can cry at the drop of a hat".  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   She had her period twice last month. Taking Avian ocps x a few months.   Review of Systems  HENT: Positive for congestion. Negative for postnasal drip, rhinorrhea, sneezing and sore throat.   Eyes: Negative for itching.  Genitourinary: Positive for menstrual problem.  Neurological: Negative for dizziness, light-headedness and headaches.  Psychiatric/Behavioral: Negative for dysphoric mood, self-injury and suicidal ideas. The patient is nervous/anxious.     There are no active problems to display for this patient.   Current Outpatient Medications on File Prior to Visit  Medication Sig Dispense Refill  . ferrous sulfate 325 (65 FE) MG tablet Take 1 tablet (325 mg total) by mouth every other day. Monday, Wednesday and Friday. Take with 6 oz orange juice 30 tablet 3  . levonorgestrel-ethinyl estradiol (AVIANE) 0.1-20 MG-MCG tablet Take 1 tablet by mouth daily. 1 Package 11  . amphetamine-dextroamphetamine (ADDERALL XR) 20 MG 24 hr capsule Take 20 mg by mouth daily.     No current facility-administered medications on file prior to visit.     No Known Allergies   Objective:  BP 106/70 (BP Location: Left Arm, Patient Position: Sitting, Cuff Size: Normal)   Pulse 79   Temp 98.3 F (36.8 C) (Oral)   Ht 5\' 6"  (1.676 m)   Wt 150 lb 9.6 oz (68.3 kg)   SpO2 97%   BMI 24.31 kg/m     Physical Exam  Constitutional: She is oriented to person, place, and time. No distress.  HENT:  Right Ear: Tympanic membrane normal.  Left Ear: Tympanic membrane normal.  Nose: Mucosal edema present. No rhinorrhea. Right sinus exhibits no maxillary sinus tenderness and no frontal sinus tenderness. Left sinus exhibits no maxillary sinus tenderness and no frontal sinus tenderness.  Mouth/Throat: Oropharynx is clear and moist and mucous membranes are normal.  Cardiovascular: Normal rate, regular rhythm and normal heart sounds.  Neurological: She is alert and oriented to person, place, and time.  Skin: Skin is warm and dry.  Psychiatric: She has a normal mood and affect. Her behavior is normal. Judgment normal.  Vitals reviewed.   Assessment and Plan :  1. Generalized anxiety disorder - Increased anxiety regarding stress at school. Denies SI or HI. Plan to start Lexapro 10mg  with instructions to increase to 20mg  after 2 weeks if needed. Recheck in 4 weeks.  - escitalopram (LEXAPRO) 10 MG tablet; Take 1 tablet (10 mg total) by mouth daily. After 2 weeks you can increase the dose to 20mg  if needed.  Dispense: 45 tablet; Refill: 1  2. Difficulty concentrating - Referral for evaluation of ADHD.  - Ambulatory referral to Psychiatry  3. History of iron deficiency anemia - con't iron supplements. Will contact with results.  - CBC with Differential/Platelet - Iron, TIBC and Ferritin Panel   Whitney Marlette Curvin, PA-C  Primary  Care at [facility] 07/25/2018 2:51 PM  Please note: Portions of this report may have been transcribed using dragon voice recognition software. Every effort was made to ensure accuracy; however, inadvertent computerized transcription errors may be present.

## 2018-07-25 NOTE — Patient Instructions (Addendum)
1) For your congestion:  Start taking zyrtec daily. (you can also try Xyzal or claritin-D) Stop your current nasal spray and Start using flonase 1-2 x/day Use saline nasal rinses every morning and night.   2) you will receive a phone call to schedule an appointment with [facility].   3) Start Lexapro 10 mg once daily (morning or evening, with or without food). After 2 weeks you can increase the dose to 31m if you feel like you need it.  It may take Lexapro a week or two to take effect.  (Please know you need to take this medication every day. Do not miss a dose. Plan to continue taking this medication at least 6-12 months. When you and your medical provider decide it's appropriate, you will need to taper off this medication.). Common side effects of SSRIs include, but are not limited to, GI upset, nausea, vomiting, insomnia, and drowsiness. Typically these side effects will resolve after 2 weeks.   Come back and see me in 4 weeks to recheck.   OTHER:  Check out "Your Brain on Love" by SRaford Pitcher   RELAXATION Consider practicing mindfulness meditation or other relaxation techniques such as deep breathing, prayer, yoga, tai chi, massage. See website mindful.org or the apps Headspace or Calm to help get started.   SLEEP  Regular exercise and reduced caffeine will help you sleep better. Practice good sleep hygeine techniques. See website sleep.org for more information.     For therapy -- [place]([facility] Free[place] [place] -[phone] (B[address]KLoni DollyMA[address] Filter[place]still [place] -[place] - [phone] - 3[place]- [phone] [phone]   Thank you for coming in today. I hope you feel we met your needs.  Feel free to call PCP if you have any  questions or further requests.  Please consider signing up for MyChart if you do not already have it, as this is a great way to communicate with me.  Best,  Whitney McVey, PA-C  IF you received an x-ray today, you will receive an invoice from [facility]. Please contact [facility] at [phone] questions or concerns regarding your invoice.   IF you received labwork today, you will receive an invoice from [facility] Please contact [facility] at [phone] questions or concerns regarding your invoice.   Our billing staff will not be able to assist you with questions regarding bills from these companies.  You will be contacted with the lab results as soon as they are available. The fastest way to get your results is to activate your My Chart account. Instructions are located on the last page of this paperwork. If you have not heard from [place] the results in 2 weeks, please contact this office.

## 2018-07-26 LAB — CBC WITH DIFFERENTIAL/PLATELET
Basophils Absolute: 0 10*3/uL (ref 0.0–0.2)
Basos: 1 %
EOS (ABSOLUTE): 0.1 10*3/uL (ref 0.0–0.4)
Eos: 1 %
Hematocrit: 36.5 % (ref 34.0–46.6)
Hemoglobin: 11.6 g/dL (ref 11.1–15.9)
Immature Grans (Abs): 0 10*3/uL (ref 0.0–0.1)
Immature Granulocytes: 0 %
Lymphocytes Absolute: 1.7 10*3/uL (ref 0.7–3.1)
Lymphs: 33 %
MCH: 24.1 pg — ABNORMAL LOW (ref 26.6–33.0)
MCHC: 31.8 g/dL (ref 31.5–35.7)
MCV: 76 fL — ABNORMAL LOW (ref 79–97)
Monocytes Absolute: 0.4 10*3/uL (ref 0.1–0.9)
Monocytes: 7 %
Neutrophils Absolute: 3 10*3/uL (ref 1.4–7.0)
Neutrophils: 58 %
Platelets: 306 10*3/uL (ref 150–450)
RBC: 4.81 x10E6/uL (ref 3.77–5.28)
RDW: 14.8 % (ref 12.3–15.4)
WBC: 5.2 10*3/uL (ref 3.4–10.8)

## 2018-07-26 LAB — IRON,TIBC AND FERRITIN PANEL
Ferritin: 11 ng/mL — ABNORMAL LOW (ref 15–150)
Iron Saturation: 7 % — CL (ref 15–55)
Iron: 33 ug/dL (ref 27–159)
Total Iron Binding Capacity: 445 ug/dL (ref 250–450)
UIBC: 412 ug/dL (ref 131–425)

## 2018-07-29 ENCOUNTER — Encounter: Payer: Self-pay | Admitting: Physician Assistant

## 2018-07-29 ENCOUNTER — Other Ambulatory Visit: Payer: Self-pay | Admitting: Physician Assistant

## 2018-07-29 ENCOUNTER — Telehealth: Payer: Self-pay | Admitting: Physician Assistant

## 2018-07-29 DIAGNOSIS — D509 Iron deficiency anemia, unspecified: Secondary | ICD-10-CM

## 2018-07-29 NOTE — Telephone Encounter (Signed)
Copied from CRM [id number]. Topic: Quick Communication - Rx Refill/Question >> Jul 29, 2018  3:11 PM Angela Nevin wrote: Medication: amphetamine-dextroamphetamine (ADDERALL XR) 20 MG 24 hr capsule   Patient is requesting a refill of this medication.     Preferred Pharmacy (with phone number or street name): [facility] [address] [place], [place] - [address] [phone] (Phone) [phone] (Fax)

## 2018-07-29 NOTE — Telephone Encounter (Signed)
Received records from Dr Alesia Morin office 07/29/18

## 2018-07-30 ENCOUNTER — Other Ambulatory Visit: Payer: Self-pay | Admitting: Physician Assistant

## 2018-07-30 DIAGNOSIS — F909 Attention-deficit hyperactivity disorder, unspecified type: Secondary | ICD-10-CM

## 2018-07-30 MED ORDER — AMPHETAMINE-DEXTROAMPHET ER 20 MG PO CP24: 20.0000 mg | ORAL_CAPSULE | Freq: Every day | ORAL | 0 refills | Status: DC

## 2018-07-30 NOTE — Progress Notes (Signed)
[facility] confirms dx ADHD and previous treatments.

## 2018-07-30 NOTE — Telephone Encounter (Signed)
Requested medication (s) are due for refill today: yes  Requested medication (s) are on the active medication list: yes  Last refill:  06/13/18  Future visit scheduled: yes  Notes to clinic:  Not delegated; historical provider    Requested Prescriptions  Pending Prescriptions Disp Refills   amphetamine-dextroamphetamine (ADDERALL XR) 20 MG 24 hr capsule      Sig: Take 1 capsule (20 mg total) by mouth daily.     Not Delegated - Psychiatry:  Stimulants/ADHD Failed - 07/29/2018  3:44 PM      Failed - This refill cannot be delegated      Failed - Urine Drug Screen completed in last 360 days.      Passed - Valid encounter within last 3 months    Recent Outpatient Visits          5 days ago Generalized anxiety disorder   [facility], Madelaine BhatElizabeth Whitney, PA-C   2 months ago Encounter for surveillance of contraceptive pills   [facility], Madelaine BhatElizabeth Whitney, PA-C   4 months ago Annual physical exam   [facility], Madelaine BhatElizabeth Whitney, PA-C   7 months ago Sore throat   [facility], Madelaine BhatElizabeth Whitney, PA-C   1 year ago Encounter for completion of form with patient   [facility], Madelaine BhatElizabeth Whitney, PA-C      Future Appointments            In 4 weeks McVey, Madelaine BhatElizabeth Whitney, PA-C [facility]

## 2018-08-01 NOTE — Telephone Encounter (Signed)
Your patient is requesting medication refill 

## 2018-08-13 ENCOUNTER — Other Ambulatory Visit: Payer: Self-pay | Admitting: Physician Assistant

## 2018-08-13 DIAGNOSIS — F909 Attention-deficit hyperactivity disorder, unspecified type: Secondary | ICD-10-CM

## 2018-08-13 NOTE — Telephone Encounter (Signed)
According to her chart records, she has a refill to be filled 12/12. It is too soon to refill this medication.

## 2018-08-14 ENCOUNTER — Telehealth: Payer: Self-pay | Admitting: Physician Assistant

## 2018-08-14 NOTE — Telephone Encounter (Signed)
LVM for pt to call the office and reschedule their appt with McVey on 08/28/18. Due to provider schedule change, McVey will not be in the office this day. When pt calls back, please reschedule with McVey at their convenience. Thank you!

## 2018-08-28 ENCOUNTER — Ambulatory Visit: Payer: No Typology Code available for payment source | Admitting: Physician Assistant

## 2018-10-02 ENCOUNTER — Telehealth: Payer: Self-pay | Admitting: Physician Assistant

## 2018-10-02 NOTE — Telephone Encounter (Signed)
Copied from CRM [id number]. Topic: General - Other >> Oct 01, 2018  5:05 PM Jilda Roche wrote: Reason for CRM: Patient is taking levonorgestrel-ethinyl estradiol (AVIANE) 0.1-20 since 05/23/18 and states that she is having a period every two weeks, she would like to know if there is something else that could be called in for her, she states this is not working for her.   Please advise  Best call back is [phone]

## 2018-10-04 NOTE — Telephone Encounter (Signed)
Please advise if something else can be called in

## 2018-11-03 NOTE — Telephone Encounter (Signed)
Please set this patient a follow up appointment with a new provider to address her contraception issue  I would not advise one of the prn providers.  Please work her in with me or [place] on a same day if necessary since she called about this so long ago.

## 2018-11-04 ENCOUNTER — Telehealth: Payer: Self-pay | Admitting: Family Medicine

## 2018-11-04 NOTE — Telephone Encounter (Signed)
Per Dr. Creta Levin on 11/03/18, I LVM for pt to call the office and schedule an appt with either Dr. Creta Levin or Dr. Leretha Pol. We will need to use a sameday so please call [place] and ask for Gaston Islam or Brandi.   See notes from 10/02/18, 10/04/18 and 11/03/18.

## 2018-12-23 ENCOUNTER — Emergency Department (HOSPITAL_COMMUNITY): Payer: BLUE CROSS/BLUE SHIELD

## 2018-12-23 ENCOUNTER — Ambulatory Visit (INDEPENDENT_AMBULATORY_CARE_PROVIDER_SITE_OTHER): Payer: No Typology Code available for payment source | Admitting: Emergency Medicine

## 2018-12-23 ENCOUNTER — Inpatient Hospital Stay (HOSPITAL_COMMUNITY)
Admission: EM | Admit: 2018-12-23 | Discharge: 2018-12-25 | DRG: 343 | Disposition: A | Discharge status: Home / Self Care | Payer: BLUE CROSS/BLUE SHIELD | Attending: General Surgery | Admitting: General Surgery

## 2018-12-23 ENCOUNTER — Encounter: Payer: Self-pay | Admitting: Emergency Medicine

## 2018-12-23 ENCOUNTER — Other Ambulatory Visit: Payer: Self-pay

## 2018-12-23 ENCOUNTER — Encounter (HOSPITAL_COMMUNITY): Payer: Self-pay

## 2018-12-23 VITALS — BP 110/62 | HR 40 | Temp 102.1°F | Resp 16 | Ht 65.5 in | Wt 144.2 lb

## 2018-12-23 DIAGNOSIS — R110 Nausea: Secondary | ICD-10-CM | POA: Diagnosis not present

## 2018-12-23 DIAGNOSIS — Z833 Family history of diabetes mellitus: Secondary | ICD-10-CM

## 2018-12-23 DIAGNOSIS — K3533 Acute appendicitis with perforation and localized peritonitis, with abscess: Secondary | ICD-10-CM | POA: Diagnosis present

## 2018-12-23 DIAGNOSIS — R1031 Right lower quadrant pain: Secondary | ICD-10-CM | POA: Insufficient documentation

## 2018-12-23 DIAGNOSIS — R1030 Lower abdominal pain, unspecified: Secondary | ICD-10-CM

## 2018-12-23 DIAGNOSIS — Z79899 Other long term (current) drug therapy: Secondary | ICD-10-CM

## 2018-12-23 DIAGNOSIS — A549 Gonococcal infection, unspecified: Secondary | ICD-10-CM

## 2018-12-23 DIAGNOSIS — K353 Acute appendicitis with localized peritonitis, without perforation or gangrene: Secondary | ICD-10-CM | POA: Diagnosis not present

## 2018-12-23 LAB — WET PREP, GENITAL
Sperm: NONE SEEN
Trich, Wet Prep: NONE SEEN
Yeast Wet Prep HPF POC: NONE SEEN

## 2018-12-23 LAB — POCT URINALYSIS DIP (MANUAL ENTRY)
Bilirubin, UA: NEGATIVE
Glucose, UA: NEGATIVE mg/dL
Nitrite, UA: NEGATIVE
Spec Grav, UA: 1.02 (ref 1.010–1.025)
Urobilinogen, UA: 1 E.U./dL
pH, UA: 6.5 (ref 5.0–8.0)

## 2018-12-23 LAB — CBC
HCT: 39.7 % (ref 36.0–46.0)
Hemoglobin: 12.5 g/dL (ref 12.0–15.0)
MCH: 25.7 pg — ABNORMAL LOW (ref 26.0–34.0)
MCHC: 31.5 g/dL (ref 30.0–36.0)
MCV: 81.7 fL (ref 80.0–100.0)
Platelets: 246 10*3/uL (ref 150–400)
RBC: 4.86 MIL/uL (ref 3.87–5.11)
RDW: 15.7 % — ABNORMAL HIGH (ref 11.5–15.5)
WBC: 19.6 10*3/uL — ABNORMAL HIGH (ref 4.0–10.5)
nRBC: 0 % (ref 0.0–0.2)

## 2018-12-23 LAB — PREGNANCY, URINE: Preg Test, Ur: NEGATIVE

## 2018-12-23 LAB — URINALYSIS, ROUTINE W REFLEX MICROSCOPIC
Bilirubin Urine: NEGATIVE
Glucose, UA: NEGATIVE mg/dL
Ketones, ur: 20 mg/dL — AB
Nitrite: NEGATIVE
Protein, ur: NEGATIVE mg/dL
Specific Gravity, Urine: 1.023 (ref 1.005–1.030)
pH: 5 (ref 5.0–8.0)

## 2018-12-23 LAB — COMPREHENSIVE METABOLIC PANEL
ALT: 11 U/L (ref 0–44)
AST: 14 U/L — ABNORMAL LOW (ref 15–41)
Albumin: 4.2 g/dL (ref 3.5–5.0)
Alkaline Phosphatase: 63 U/L (ref 38–126)
Anion gap: 11 (ref 5–15)
BUN: 8 mg/dL (ref 6–20)
CO2: 23 mmol/L (ref 22–32)
Calcium: 9.4 mg/dL (ref 8.9–10.3)
Chloride: 101 mmol/L (ref 98–111)
Creatinine, Ser: 0.76 mg/dL (ref 0.44–1.00)
GFR calc Af Amer: >60 mL/min (ref >60)
GFR calc non Af Amer: >60 mL/min (ref >60)
Glucose, Bld: 93 mg/dL (ref 70–99)
Potassium: 3.6 mmol/L (ref 3.5–5.1)
Sodium: 135 mmol/L (ref 135–145)
Total Bilirubin: 0.8 mg/dL (ref 0.3–1.2)
Total Protein: 8.6 g/dL — ABNORMAL HIGH (ref 6.5–8.1)

## 2018-12-23 LAB — DIFFERENTIAL
Basophils Absolute: 0 10*3/uL (ref 0.0–0.1)
Basophils Relative: 0 %
Eosinophils Absolute: 0 10*3/uL (ref 0.0–0.5)
Eosinophils Relative: 0 %
Lymphocytes Relative: 8 %
Lymphs Abs: 1.6 10*3/uL (ref 0.7–4.0)
Monocytes Absolute: 0.8 10*3/uL (ref 0.1–1.0)
Monocytes Relative: 4 %
Neutro Abs: 17 10*3/uL — ABNORMAL HIGH (ref 1.7–7.7)
Neutrophils Relative %: 87 %

## 2018-12-23 LAB — POCT CBC
Granulocyte percent: 88.3 %G — AB (ref 37–80)
HCT, POC: 38.3 % (ref 29–41)
Hemoglobin: 12.9 g/dL (ref 11–14.6)
Lymph, poc: 1.5 (ref 0.6–3.4)
MCH, POC: 25.9 pg — AB (ref 27–31.2)
MCHC: 33.7 g/dL (ref 31.8–35.4)
MCV: 76.9 fL (ref 76–111)
MID (cbc): 0.9 (ref 0–0.9)
MPV: 9.6 fL (ref 0–99.8)
POC Granulocyte: 18.1 — AB (ref 2–6.9)
POC LYMPH PERCENT: 7.5 %L — AB (ref 10–50)
POC MID %: 4.2 %M (ref 0–12)
Platelet Count, POC: 224 10*3/uL (ref 142–424)
RBC: 4.98 M/uL (ref 4.04–5.48)
RDW, POC: 15.6 %
WBC: 20.5 10*3/uL — AB (ref 4.6–10.2)

## 2018-12-23 LAB — POCT URINE PREGNANCY: Preg Test, Ur: NEGATIVE

## 2018-12-23 LAB — I-STAT BETA HCG BLOOD, ED (MC, WL, AP ONLY): I-stat hCG, quantitative: 9.4 m[IU]/mL — ABNORMAL HIGH (ref <5)

## 2018-12-23 LAB — LIPASE, BLOOD: Lipase: 23 U/L (ref 11–51)

## 2018-12-23 MED ORDER — METRONIDAZOLE IN NACL 5-0.79 MG/ML-% IV SOLN
500.0000 mg | Freq: Three times a day (TID) | INTRAVENOUS | Status: DC
  Administered 2018-12-23 – 2018-12-25 (×5): 500 mg via INTRAVENOUS
  Filled 2018-12-23 (×6): qty 100

## 2018-12-23 MED ORDER — SODIUM CHLORIDE 0.9% FLUSH
3.0000 mL | Freq: Once | INTRAVENOUS | Status: AC
  Administered 2018-12-23: 17:00:00 3 mL via INTRAVENOUS

## 2018-12-23 MED ORDER — ONDANSETRON HCL 4 MG/2ML IJ SOLN
4.0000 mg | Freq: Once | INTRAMUSCULAR | Status: AC
  Administered 2018-12-23: 4 mg via INTRAVENOUS
  Filled 2018-12-23: qty 2

## 2018-12-23 MED ORDER — SODIUM CHLORIDE (PF) 0.9 % IJ SOLN
INTRAMUSCULAR | Status: AC
  Filled 2018-12-23: qty 50

## 2018-12-23 MED ORDER — KCL IN DEXTROSE-NACL 20-5-0.9 MEQ/L-%-% IV SOLN
INTRAVENOUS | Status: DC
  Administered 2018-12-23: 23:00:00 via INTRAVENOUS
  Filled 2018-12-23 (×2): qty 1000

## 2018-12-23 MED ORDER — ONDANSETRON HCL 4 MG/2ML IJ SOLN
4.0000 mg | Freq: Four times a day (QID) | INTRAMUSCULAR | Status: DC | PRN
  Administered 2018-12-24: 17:00:00 4 mg via INTRAVENOUS
  Filled 2018-12-23: qty 2

## 2018-12-23 MED ORDER — SODIUM CHLORIDE 0.9 % IV SOLN
2.0000 g | INTRAVENOUS | Status: DC
  Administered 2018-12-24: 2 g via INTRAVENOUS
  Filled 2018-12-23: qty 20
  Filled 2018-12-23: qty 2

## 2018-12-23 MED ORDER — METRONIDAZOLE IN NACL 5-0.79 MG/ML-% IV SOLN
500.0000 mg | Freq: Once | INTRAVENOUS | Status: AC
  Administered 2018-12-24: 500 mg via INTRAVENOUS
  Filled 2018-12-23: qty 100

## 2018-12-23 MED ORDER — ACETAMINOPHEN 10 MG/ML IV SOLN
1000.0000 mg | Freq: Four times a day (QID) | INTRAVENOUS | Status: DC
  Administered 2018-12-24 (×3): 1000 mg via INTRAVENOUS
  Filled 2018-12-23 (×5): qty 100

## 2018-12-23 MED ORDER — MORPHINE SULFATE (PF) 4 MG/ML IV SOLN
4.0000 mg | Freq: Once | INTRAVENOUS | Status: AC
  Administered 2018-12-23: 4 mg via INTRAVENOUS
  Filled 2018-12-23: qty 1

## 2018-12-23 MED ORDER — IOHEXOL 300 MG/ML  SOLN
100.0000 mL | Freq: Once | INTRAMUSCULAR | Status: AC | PRN
  Administered 2018-12-23: 100 mL via INTRAVENOUS

## 2018-12-23 MED ORDER — MORPHINE SULFATE (PF) 2 MG/ML IV SOLN
1.0000 mg | INTRAVENOUS | Status: DC | PRN
  Administered 2018-12-23 – 2018-12-24 (×2): 2 mg via INTRAVENOUS
  Administered 2018-12-24: 1 mg via INTRAVENOUS
  Filled 2018-12-23 (×4): qty 1

## 2018-12-23 MED ORDER — SODIUM CHLORIDE 0.9 % IV SOLN
2.0000 g | Freq: Once | INTRAVENOUS | Status: AC
  Administered 2018-12-23: 21:00:00 2 g via INTRAVENOUS
  Filled 2018-12-23: qty 20

## 2018-12-23 MED ORDER — ONDANSETRON 4 MG PO TBDP: 4.0000 mg | ORAL_TABLET | Freq: Four times a day (QID) | ORAL | Status: DC | PRN

## 2018-12-23 MED ORDER — PANTOPRAZOLE SODIUM 40 MG IV SOLR
40.0000 mg | Freq: Every day | INTRAVENOUS | Status: DC
  Administered 2018-12-24 (×2): 40 mg via INTRAVENOUS
  Filled 2018-12-23 (×2): qty 40

## 2018-12-23 NOTE — ED Triage Notes (Addendum)
Patient sent over from primary doctor for further evaluation for appendicitis.   C/O mid abdominal pain x4 days 8/10.   Patient ambulated back to room with no problems.   Lab results from primary office in room.  WBC-20.5

## 2018-12-23 NOTE — Patient Instructions (Addendum)
  Go to [facility] emergency room now for further evaluation and treatment. Suspected acute appendicitis.   If you have lab work done today you will be contacted with your lab results within the next 2 weeks.  If you have not heard from [place] then please contact us. The fastest way to get your results is to register for My Chart.   IF you received an x-ray today, you will receive an invoice from [facility]. Please contact [facility] at [phone] with questions or concerns regarding your invoice.   IF you received labwork today, you will receive an invoice from [place]. Please contact [facility] at [phone] with questions or concerns regarding your invoice.   Our billing staff will not be able to assist you with questions regarding bills from these companies.  You will be contacted with the lab results as soon as they are available. The fastest way to get your results is to activate your My Chart account. Instructions are located on the last page of this paperwork. If you have not heard from [place] regarding the results in 2 weeks, please contact this office.

## 2018-12-23 NOTE — Progress Notes (Addendum)
Patient has a temp of 101.8 and MEWS score of 2 and she was for ice, MD Carolynne Edouard was notified. MD Carolynne Edouard said to start IV antibiotics and patient to be NPO.

## 2018-12-23 NOTE — H&P (Signed)
Sue Allen is an 23 y.o. female.   Chief Complaint: abd pain HPI:  The patient is a 23 year old black female who presents with a 4 day history of abd pain. She denies nausea or vomiting. She denies diarrhea. She denies upper respiratory symptoms although she is coughing. She has been having fever to 102 at home. She came to ER where CT questions possibility of appendicitis but can't rule out PID either. She smokes marijuana daily  Past Medical History:  Diagnosis Date  . Allergy   . Anemia     History reviewed. No pertinent surgical history.  Family History  Problem Relation Age of Onset  . Diabetes Mother   . Hyperlipidemia Mother   . Hypertension Mother   . Hyperlipidemia Father   . Diabetes Maternal Grandmother   . Alzheimer's disease Maternal Grandmother   . Glaucoma Maternal Grandmother    Social History:  reports that she has never smoked. She has never used smokeless tobacco. She reports previous alcohol use of about 1.0 standard drinks of alcohol per week. She reports current drug use. Drug: Marijuana.  Allergies: No Known Allergies  Medications Prior to Admission  Medication Sig Dispense Refill  . Aspirin-Acetaminophen-Caffeine (PAMPRIN MAX PO) Take 1 tablet by mouth at bedtime as needed (pain).    Marland Kitchen. escitalopram (LEXAPRO) 10 MG tablet Take 1 tablet (10 mg total) by mouth daily. After 2 weeks you can increase the dose to 20mg  if needed. (Patient taking differently: Take 10 mg by mouth daily. ) 45 tablet 1  . ferrous sulfate 325 (65 FE) MG tablet Take 1 tablet (325 mg total) by mouth every other day. Monday, Wednesday and Friday. Take with 6 oz orange juice 30 tablet 3  . fexofenadine (ALLEGRA) 180 MG tablet Take 180 mg by mouth daily.    Marland Kitchen. levonorgestrel-ethinyl estradiol (AVIANE) 0.1-20 MG-MCG tablet Take 1 tablet by mouth daily. 1 Package 11  . naproxen sodium (ALEVE) 220 MG tablet Take 220 mg by mouth daily as needed (pain).    Marland Kitchen. amphetamine-dextroamphetamine  (ADDERALL XR) 20 MG 24 hr capsule Take 1 capsule (20 mg total) by mouth daily. 30 capsule 0    Results for orders placed or performed during the hospital encounter of 12/23/18 (from the past 48 hour(s))  I-Stat beta hCG blood, ED     Status: Abnormal   Collection Time: 12/23/18  5:30 PM  Result Value Ref Range   I-stat hCG, quantitative 9.4 (H) <5 mIU/mL   Comment 3            Comment:   GEST. AGE      CONC.  (mIU/mL)   <=1 WEEK        5 - 50     2 WEEKS       50 - 500     3 WEEKS       100 - 10,000     4 WEEKS     1,000 - 30,000        FEMALE AND NON-PREGNANT FEMALE:     LESS THAN 5 mIU/mL   Lipase, blood     Status: None   Collection Time: 12/23/18  5:41 PM  Result Value Ref Range   Lipase 23 11 - 51 U/L    Comment: Performed at [facility], [address] [place], [place] [address]  Comprehensive metabolic panel     Status: Abnormal   Collection Time: 12/23/18  5:41 PM  Result Value Ref Range  Sodium 135 135 - 145 mmol/L   Potassium 3.6 3.5 - 5.1 mmol/L   Chloride 101 98 - 111 mmol/L   CO2 23 22 - 32 mmol/L   Glucose, Bld 93 70 - 99 mg/dL   BUN 8 6 - 20 mg/dL   Creatinine, Ser 1.61 0.44 - 1.00 mg/dL   Calcium 9.4 8.9 - 09.6 mg/dL   Total Protein 8.6 (H) 6.5 - 8.1 g/dL   Albumin 4.2 3.5 - 5.0 g/dL   AST 14 (L) 15 - 41 U/L   ALT 11 0 - 44 U/L   Alkaline Phosphatase 63 38 - 126 U/L   Total Bilirubin 0.8 0.3 - 1.2 mg/dL   GFR calc non Af Amer >60 >60 mL/min   GFR calc Af Amer >60 >60 mL/min   Anion gap 11 5 - 15    Comment: Performed at [facility], [address] [place], [place] [address]  CBC     Status: Abnormal   Collection Time: 12/23/18  5:41 PM  Result Value Ref Range   WBC 19.6 (H) 4.0 - 10.5 K/uL   RBC 4.86 3.87 - 5.11 MIL/uL   Hemoglobin 12.5 12.0 - 15.0 g/dL   HCT 98.1 19.1 - 47.8 %   MCV 81.7 80.0 - 100.0 fL   MCH 25.7 (L) 26.0 - 34.0 pg   MCHC 31.5 30.0 - 36.0 g/dL   RDW 29.5 (H) 62.1 - 30.8 %   Platelets 246  150 - 400 K/uL   nRBC 0.0 0.0 - 0.2 %    Comment: Performed at [facility], [address] [place], [place] [address]  Differential     Status: Abnormal   Collection Time: 12/23/18  5:41 PM  Result Value Ref Range   Neutrophils Relative % 87 %   Neutro Abs 17.0 (H) 1.7 - 7.7 K/uL   Lymphocytes Relative 8 %   Lymphs Abs 1.6 0.7 - 4.0 K/uL   Monocytes Relative 4 %   Monocytes Absolute 0.8 0.1 - 1.0 K/uL   Eosinophils Relative 0 %   Eosinophils Absolute 0.0 0.0 - 0.5 K/uL   Basophils Relative 0 %   Basophils Absolute 0.0 0.0 - 0.1 K/uL    Comment: Performed at [facility], [address], [place], [place] [address]  Pregnancy, urine     Status: None   Collection Time: 12/23/18  6:27 PM  Result Value Ref Range   Preg Test, Ur NEGATIVE NEGATIVE    Comment:        THE SENSITIVITY OF THIS METHODOLOGY IS >20 mIU/mL. Performed at [facility], [address], [place], [place] [address]   Urinalysis, Routine w reflex microscopic     Status: Abnormal   Collection Time: 12/23/18  6:27 PM  Result Value Ref Range   Color, Urine YELLOW YELLOW   APPearance HAZY (A) CLEAR   Specific Gravity, Urine 1.023 1.005 - 1.030   pH 5.0 5.0 - 8.0   Glucose, UA NEGATIVE NEGATIVE mg/dL   Hgb urine dipstick MODERATE (A) NEGATIVE   Bilirubin Urine NEGATIVE NEGATIVE   Ketones, ur 20 (A) NEGATIVE mg/dL   Protein, ur NEGATIVE NEGATIVE mg/dL   Nitrite NEGATIVE NEGATIVE   Leukocytes,Ua SMALL (A) NEGATIVE   RBC / HPF 6-10 0 - 5 RBC/hpf   WBC, UA 11-20 0 - 5 WBC/hpf   Bacteria, UA RARE (A) NONE SEEN   Squamous Epithelial / LPF 0-5 0 - 5   Mucus  PRESENT     Comment: Performed at [facility], [address], [place], [place] [address]  Wet prep, genital     Status: Abnormal   Collection Time: 12/23/18  8:52 PM  Result Value Ref Range   Yeast Wet Prep HPF POC NONE SEEN NONE SEEN   Trich, Wet Prep NONE SEEN NONE SEEN   Clue Cells  Wet Prep HPF POC PRESENT (A) NONE SEEN   WBC, Wet Prep HPF POC MANY (A) NONE SEEN   Sperm NONE SEEN     Comment: Performed at [facility] [address] [place], [place] [address]   Ct Abdomen Pelvis W Contrast  Result Date: 12/23/2018 CLINICAL DATA:  Mid abdominal pain over the last 4 days which is severe. Assess for possible appendicitis. EXAM: CT ABDOMEN AND PELVIS WITH CONTRAST TECHNIQUE: Multidetector CT imaging of the abdomen and pelvis was performed using the standard protocol following bolus administration of intravenous contrast. CONTRAST:  OMNIPAQUE IOHEXOL 300 MG/ML  SOLN COMPARISON:  None. FINDINGS: Lower chest: Lung bases are clear. Hepatobiliary: The dome of the liver is excluded from the scan. Otherwise, the liver appears normal. No calcified gallstones. Pancreas: Normal Spleen: Normal Adrenals/Urinary Tract: Adrenal glands are normal. Kidneys are normal. Bladder is normal. Stomach/Bowel: The cecum is very low within the pelvis. The anatomy is quite difficult in that region. I think I can identify the pelvis extending along the pelvic sidewall the way down to the called a sac. The appendix shows mild hyperemia of the wall which could go along with early appendicitis. There is some free fluid in the region and mild edema the pelvic fat. The differential diagnosis would be that pelvic inflammatory disease. Otherwise, no abnormal bowel finding is seen. Vascular/Lymphatic: Normal Reproductive: No specific CT finding. Other: Small amount of free fluid as noted above. Musculoskeletal: Negative IMPRESSION: Findings which are suspicious for appendicitis but not conclusive. The anatomy is difficult in this case. Cecum is very low lying. There is a small amount of free fluid in the pelvis and I think there is mild edema of the pelvic fat. I think the appendix can be seen extending down into the cul-de-sac along the right side. There is hyperemia of the wall suggesting early  appendicitis. As noted above, the differential diagnosis does include pelvic inflammatory disease. Electronically Signed   By: Paulina Fusi M.D.   On: 12/23/2018 20:19    Review of Systems  Constitutional: Positive for chills, diaphoresis, fever and malaise/fatigue.  HENT: Negative.   Eyes: Negative.   Respiratory: Negative.   Cardiovascular: Negative.   Gastrointestinal: Positive for abdominal pain. Negative for diarrhea, nausea and vomiting.  Genitourinary: Negative.   Musculoskeletal: Negative.   Skin: Negative.   Neurological: Negative.   Endo/Heme/Allergies: Negative.   Psychiatric/Behavioral: Negative.     Blood pressure 113/73, pulse 97, temperature (!) 101.8 F (38.8 C), temperature source Oral, resp. rate 18, height 5\' 5"  (1.651 m), weight 65.3 kg, last menstrual period 12/16/2018, SpO2 100 %. Physical Exam  Constitutional: She is oriented to person, place, and time. She appears well-developed and well-nourished. No distress.  HENT:  Head: Normocephalic and atraumatic.  Mouth/Throat: No oropharyngeal exudate.  Eyes: Pupils are equal, round, and reactive to light. Conjunctivae and EOM are normal.  Neck: Normal range of motion. Neck supple.  Cardiovascular: Normal rate, regular rhythm and normal heart sounds.  Respiratory: Effort normal and breath sounds normal. No stridor. No respiratory distress.  Musculoskeletal: Normal range of motion.  General: Edema present. No tenderness.  Neurological: She is alert and oriented to person, place, and time. Coordination normal.  Skin: Skin is warm and dry. No erythema.  Psychiatric: She has a normal mood and affect. Her behavior is normal. Thought content normal.     Assessment/Plan The patient has been having lower abd pain with fever. Appendicitis is certainly a concern as well as possible PID. Will plan to admit and start on broad spectrum abx. Will check pelvic cultures. If this is unremarkable then she may benefit from  appendectomy tomorrow. I will discuss with primary team in am  Chevis Pretty III, MD 12/23/2018, 10:19 PM

## 2018-12-23 NOTE — ED Notes (Signed)
ED TO INPATIENT HANDOFF REPORT  Name/Age/Gender Sue Allen 23 y.o. female  Code Status   Home/SNF/Other Home  Chief Complaint appendicitis  Level of Care/Admitting Diagnosis ED Disposition    ED Disposition Condition Comment   Admit  Hospital Area: [facility] [[id number]]  Level of Care: Med-Surg [16]  Diagnosis: Appendicitis [235361]  Admitting Physician: Griselda Miner [phone]  Attending Physician: CCS, MD [[id number]  PT Class (Do Not Modify): Observation [104]  PT Acc Code (Do Not Modify): Observation [10022]       Medical History Past Medical History:  Diagnosis Date  . Allergy   . Anemia     Allergies No Known Allergies  IV Location/Drains/Wounds Patient Lines/Drains/Airways Status   Active Line/Drains/Airways    Name:   Placement date:   Placement time:   Site:   Days:   Peripheral IV 12/23/18 Right Antecubital   12/23/18    1723    Antecubital   less than 1          Labs/Imaging Results for orders placed or performed during the hospital encounter of 12/23/18 (from the past 48 hour(s))  I-Stat beta hCG blood, ED     Status: Abnormal   Collection Time: 12/23/18  5:30 PM  Result Value Ref Range   I-stat hCG, quantitative 9.4 (H) <5 mIU/mL   Comment 3            Comment:   GEST. AGE      CONC.  (mIU/mL)   <=1 WEEK        5 - 50     2 WEEKS       50 - 500     3 WEEKS       100 - 10,000     4 WEEKS     1,000 - 30,000        FEMALE AND NON-PREGNANT FEMALE:     LESS THAN 5 mIU/mL   Lipase, blood     Status: None   Collection Time: 12/23/18  5:41 PM  Result Value Ref Range   Lipase 23 11 - 51 U/L    Comment: Performed at [facility], [address], [place], [place] [address]  Comprehensive metabolic panel     Status: Abnormal   Collection Time: 12/23/18  5:41 PM  Result Value Ref Range   Sodium 135 135 - 145 mmol/L   Potassium 3.6 3.5 - 5.1 mmol/L   Chloride 101 98 - 111 mmol/L   CO2 23 22 - 32 mmol/L   Glucose, Bld 93 70 - 99 mg/dL   BUN 8 6 - 20 mg/dL   Creatinine, Ser 6.76 0.44 - 1.00 mg/dL   Calcium 9.4 8.9 - 19.5 mg/dL   Total Protein 8.6 (H) 6.5 - 8.1 g/dL   Albumin 4.2 3.5 - 5.0 g/dL   AST 14 (L) 15 - 41 U/L   ALT 11 0 - 44 U/L   Alkaline Phosphatase 63 38 - 126 U/L   Total Bilirubin 0.8 0.3 - 1.2 mg/dL   GFR calc non Af Amer >60 >60 mL/min   GFR calc Af Amer >60 >60 mL/min   Anion gap 11 5 - 15    Comment: Performed at [facility], [address] [place], [place] [address]  CBC     Status: Abnormal   Collection Time: 12/23/18  5:41 PM  Result Value Ref Range   WBC 19.6 (H) 4.0 - 10.5 K/uL   RBC  4.86 3.87 - 5.11 MIL/uL   Hemoglobin 12.5 12.0 - 15.0 g/dL   HCT 39.7 09.636.0 - 04.546.0 %   MCV 81.7 80.0 - 100.0 fL   MCH 25.7 (L) 26.0 - 34.0 pg   MCHC 31.5 30.0 - 36.0 g/dL   RDW 40.915.7 (H) 81.111.5 - 91.415.5 %   Platelets 246 150 - 400 K/uL   nRBC 0.0 0.0 - 0.2 %    Comment: Performed at [facility], [address] [place], [place] [address]  Differential     Status: Abnormal   Collection Time: 12/23/18  5:41 PM  Result Value Ref Range   Neutrophils Relative % 87 %   Neutro Abs 17.0 (H) 1.7 - 7.7 K/uL   Lymphocytes Relative 8 %   Lymphs Abs 1.6 0.7 - 4.0 K/uL   Monocytes Relative 4 %   Monocytes Absolute 0.8 0.1 - 1.0 K/uL   Eosinophils Relative 0 %   Eosinophils Absolute 0.0 0.0 - 0.5 K/uL   Basophils Relative 0 %   Basophils Absolute 0.0 0.0 - 0.1 K/uL    Comment: Performed at [facility], [address] [place], [place] [address]  Pregnancy, urine     Status: None   Collection Time: 12/23/18  6:27 PM  Result Value Ref Range   Preg Test, Ur NEGATIVE NEGATIVE    Comment:        THE SENSITIVITY OF THIS METHODOLOGY IS >20 mIU/mL. Performed at [facility], [address] [place], [place] [address]   Urinalysis, Routine w reflex microscopic     Status: Abnormal   Collection Time: 12/23/18  6:27 PM   Result Value Ref Range   Color, Urine YELLOW YELLOW   APPearance HAZY (A) CLEAR   Specific Gravity, Urine 1.023 1.005 - 1.030   pH 5.0 5.0 - 8.0   Glucose, UA NEGATIVE NEGATIVE mg/dL   Hgb urine dipstick MODERATE (A) NEGATIVE   Bilirubin Urine NEGATIVE NEGATIVE   Ketones, ur 20 (A) NEGATIVE mg/dL   Protein, ur NEGATIVE NEGATIVE mg/dL   Nitrite NEGATIVE NEGATIVE   Leukocytes,Ua SMALL (A) NEGATIVE   RBC / HPF 6-10 0 - 5 RBC/hpf   WBC, UA 11-20 0 - 5 WBC/hpf   Bacteria, UA RARE (A) NONE SEEN   Squamous Epithelial / LPF 0-5 0 - 5   Mucus PRESENT     Comment: Performed at [facility], [address] [place], [place] [address]   Ct Abdomen Pelvis W Contrast  Result Date: 12/23/2018 CLINICAL DATA:  Mid abdominal pain over the last 4 days which is severe. Assess for possible appendicitis. EXAM: CT ABDOMEN AND PELVIS WITH CONTRAST TECHNIQUE: Multidetector CT imaging of the abdomen and pelvis was performed using the standard protocol following bolus administration of intravenous contrast. CONTRAST:  100mL OMNIPAQUE IOHEXOL 300 MG/ML  SOLN COMPARISON:  None. FINDINGS: Lower chest: Lung bases are clear. Hepatobiliary: The dome of the liver is excluded from the scan. Otherwise, the liver appears normal. No calcified gallstones. Pancreas: Normal Spleen: Normal Adrenals/Urinary Tract: Adrenal glands are normal. Kidneys are normal. Bladder is normal. Stomach/Bowel: The cecum is very low within the pelvis. The anatomy is quite difficult in that region. I think I can identify the pelvis extending along the pelvic sidewall the way down to the called a sac. The appendix shows mild hyperemia of the wall which could go along with early appendicitis. There is some free fluid in the region and mild edema the  pelvic fat. The differential diagnosis would be that pelvic inflammatory disease. Otherwise, no abnormal bowel finding is seen. Vascular/Lymphatic: Normal Reproductive: No specific CT finding.  Other: Small amount of free fluid as noted above. Musculoskeletal: Negative IMPRESSION: Findings which are suspicious for appendicitis but not conclusive. The anatomy is difficult in this case. Cecum is very low lying. There is a small amount of free fluid in the pelvis and I think there is mild edema of the pelvic fat. I think the appendix can be seen extending down into the cul-de-sac along the right side. There is hyperemia of the wall suggesting early appendicitis. As noted above, the differential diagnosis does include pelvic inflammatory disease. Electronically Signed   By: Paulina Fusi M.D.   On: 12/23/2018 20:19    Pending Labs Unresulted Labs (From admission, onward)    Start     Ordered   12/23/18 2038  RPR  (STI Panel)  Once,   STAT     12/23/18 2037   12/23/18 2038  HIV antibody  (STI Panel)  Once,   STAT     12/23/18 2037   12/23/18 2038  Wet prep, genital  (STI Panel)  Once,   STAT     12/23/18 2037   Signed and Held  Basic metabolic panel  Tomorrow morning,   R     Signed and Held   Signed and Held  CBC  Tomorrow morning,   R     Signed and Held          Vitals/Pain Today's Vitals   12/23/18 1725 12/23/18 1846 12/23/18 1930 12/23/18 2054  BP: 116/83 123/66 112/76   Pulse: 89 88    Resp: 16 16    Temp:      SpO2: 100% 100%    Weight:      Height:      PainSc:    5     Isolation Precautions No active isolations  Medications Medications  sodium chloride (PF) 0.9 % injection (has no administration in time range)  cefTRIAXone (ROCEPHIN) 2 g in sodium chloride 0.9 % 100 mL IVPB (2 g Intravenous New Bag/Given 12/23/18 2109)    And  metroNIDAZOLE (FLAGYL) IVPB 500 mg (has no administration in time range)  sodium chloride flush (NS) 0.9 % injection 3 mL (3 mLs Intravenous Given 12/23/18 1725)  morphine 4 MG/ML injection 4 mg (4 mg Intravenous Given 12/23/18 1844)  ondansetron (ZOFRAN) injection 4 mg (4 mg Intravenous Given 12/23/18 1844)  iohexol (OMNIPAQUE) 300 MG/ML  solution 100 mL (100 mLs Intravenous Contrast Given 12/23/18 1956)    Mobility walks

## 2018-12-23 NOTE — Progress Notes (Signed)
[name] 23 y.o.   Chief Complaint  Patient presents with  . Abdominal Pain     x 3 days  . Nausea    NO VOMITING    HISTORY OF PRESENT ILLNESS: This is a 23 y.o. female complaining of lower abdominal pain progressively getting worse over the past 3 days along with nausea and fever today.  Decreased appetite.  No urinary symptoms.  Menstrual cycle finished 4 days ago.  No other significant symptoms.  HPI   Prior to Admission medications   Medication Sig Start Date End Date Taking? Authorizing Provider  escitalopram (LEXAPRO) 10 MG tablet Take 1 tablet (10 mg total) by mouth daily. After 2 weeks you can increase the dose to 20mg  if needed. 07/25/18  Yes McVey, Madelaine Bhat, PA-C  ferrous sulfate 325 (65 FE) MG tablet Take 1 tablet (325 mg total) by mouth every other day. Monday, Wednesday and Friday. Take with 6 oz orange juice 05/29/18  Yes McVey, Madelaine Bhat, PA-C  levonorgestrel-ethinyl estradiol (AVIANE) 0.1-20 MG-MCG tablet Take 1 tablet by mouth daily. 05/23/18  Yes McVey, Madelaine Bhat, PA-C  amphetamine-dextroamphetamine (ADDERALL XR) 20 MG 24 hr capsule Take 1 capsule (20 mg total) by mouth daily. 08/29/18 09/28/18  McVey, Madelaine Bhat, PA-C    No Known Allergies  There are no active problems to display for this patient.   Past Medical History:  Diagnosis Date  . Allergy   . Anemia     No past surgical history on file.  Social History   Socioeconomic History  . Marital status: Single    Spouse name: Not on file  . Number of children: Not on file  . Years of education: Not on file  . Highest education level: Not on file  Occupational History  . Not on file  Social Needs  . Financial resource strain: Not on file  . Food insecurity:    Worry: Not on file    Inability: Not on file  . Transportation needs:    Medical: Not on file    Non-medical: Not on file  Tobacco Use  . Smoking status: Never Smoker  . Smokeless tobacco: Never Used   Substance and Sexual Activity  . Alcohol use: Not Currently    Alcohol/week: 1.0 standard drinks    Types: 1 Standard drinks or equivalent per week  . Drug use: Yes    Types: Marijuana  . Sexual activity: Never  Lifestyle  . Physical activity:    Days per week: Not on file    Minutes per session: Not on file  . Stress: Not on file  Relationships  . Social connections:    Talks on phone: Not on file    Gets together: Not on file    Attends religious service: Not on file    Active member of club or organization: Not on file    Attends meetings of clubs or organizations: Not on file    Relationship status: Not on file  . Intimate partner violence:    Fear of current or ex partner: Not on file    Emotionally abused: Not on file    Physically abused: Not on file    Forced sexual activity: Not on file  Other Topics Concern  . Not on file  Social History Narrative  . Not on file    Family History  Problem Relation Age of Onset  . Diabetes Mother   . Hyperlipidemia Mother   . Hypertension Mother   . Hyperlipidemia  Father   . Diabetes Maternal Grandmother   . Alzheimer's disease Maternal Grandmother   . Glaucoma Maternal Grandmother      Review of Systems  Constitutional: Positive for fever.  HENT: Negative for sore throat.   Respiratory: Negative.  Negative for cough and shortness of breath.   Cardiovascular: Negative.  Negative for chest pain and palpitations.  Gastrointestinal: Positive for abdominal pain and nausea. Negative for blood in stool, diarrhea and vomiting.  Genitourinary: Negative for dysuria, flank pain and hematuria.  Musculoskeletal: Negative.   Skin: Negative for rash.  Neurological: Negative for dizziness and headaches.  All other systems reviewed and are negative.   Vitals:   12/23/18 1515  BP: 110/62  Pulse: (!) 40  Resp: 16  Temp: (!) 102.1 F (38.9 C)  SpO2: 100%    Physical Exam Vitals signs reviewed.  Constitutional:       Appearance: She is well-developed.  HENT:     Head: Normocephalic and atraumatic.     Mouth/Throat:     Mouth: Mucous membranes are moist.     Pharynx: Oropharynx is clear.  Eyes:     Extraocular Movements: Extraocular movements intact.     Conjunctiva/sclera: Conjunctivae normal.     Pupils: Pupils are equal, round, and reactive to light.  Cardiovascular:     Rate and Rhythm: Normal rate and regular rhythm.     Heart sounds: Normal heart sounds.  Pulmonary:     Effort: Pulmonary effort is normal.     Breath sounds: Normal breath sounds.  Abdominal:     General: Abdomen is flat. There is no distension.     Tenderness: There is abdominal tenderness in the right lower quadrant, periumbilical area and left lower quadrant. There is guarding.  Musculoskeletal: Normal range of motion.  Skin:    General: Skin is warm and dry.     Capillary Refill: Capillary refill takes less than 2 seconds.  Neurological:     General: No focal deficit present.     Mental Status: She is alert and oriented to person, place, and time.  Psychiatric:        Mood and Affect: Mood normal.        Behavior: Behavior normal.      Results for orders placed or performed in visit on 12/23/18 (from the past 24 hour(s))  POCT urine pregnancy     Status: None   Collection Time: 12/23/18  3:50 PM  Result Value Ref Range   Preg Test, Ur Negative Negative  POCT urinalysis dipstick     Status: Abnormal   Collection Time: 12/23/18  3:50 PM  Result Value Ref Range   Color, UA yellow yellow   Clarity, UA clear clear   Glucose, UA negative negative mg/dL   Bilirubin, UA negative negative   Ketones, POC UA trace (5) (A) negative mg/dL   Spec Grav, UA 1.610 9.604 - 1.025   Blood, UA moderate (A) negative   pH, UA 6.5 5.0 - 8.0   Protein Ur, POC trace (A) negative mg/dL   Urobilinogen, UA 1.0 0.2 or 1.0 E.U./dL   Nitrite, UA Negative Negative   Leukocytes, UA Small (1+) (A) Negative  POCT CBC     Status: Abnormal    Collection Time: 12/23/18  3:56 PM  Result Value Ref Range   WBC 20.5 (A) 4.6 - 10.2 K/uL   Lymph, poc 1.5 0.6 - 3.4   POC LYMPH PERCENT 7.5 (A) 10 - 50 %L   MID (  cbc) 0.9 0 - 0.9   POC MID % 4.2 0 - 12 %M   POC Granulocyte 18.1 (A) 2 - 6.9   Granulocyte percent 88.3 (A) 37 - 80 %G   RBC 4.98 4.04 - 5.48 M/uL   Hemoglobin 12.9 11 - 14.6 g/dL   HCT, POC 16.138.3 29 - 41 %   MCV 76.9 76 - 111 fL   MCH, POC 25.9 (A) 27 - 31.2 pg   MCHC 33.7 31.8 - 35.4 g/dL   RDW, POC 09.615.6 %   Platelet Count, POC 224 142 - 424 K/uL   MPV 9.6 0 - 99.8 fL     ASSESSMENT & PLAN: Suspected appendicitis.  [facility] for further evaluation and treatment. Marjorie was seen today for abdominal pain and nausea.  Diagnoses and all orders for this visit:  Right lower quadrant abdominal pain Comments: Suspected acute appendicitis versus TOA. Orders: -     Cancel: POCT urinalysis dipstick -     Cancel: POCT urine pregnancy -     Cancel: POCT CBC -     Comprehensive metabolic panel -     POCT urine pregnancy -     POCT urinalysis dipstick -     POCT CBC  Nausea without vomiting -     Cancel: POCT urinalysis dipstick -     Cancel: POCT urine pregnancy   Patient Instructions    [facility] now for further evaluation and treatment. Suspected acute appendicitis.   If you have lab work done today you will be contacted with your lab results within the next 2 weeks.  If you have not heard from us then please contact us. The fastest way to get your results is to register for My Chart.   IF you received an x-ray today, you will receive an invoice from [facility]. Please contact [facility] at [phone] with questions or concerns regarding your invoice.   IF you received labwork today, you will receive an invoice from [facility]. Please contact [facility] at [phone] with questions or concerns regarding your invoice.   Our  billing staff will not be able to assist you with questions regarding bills from these companies.  You will be contacted with the lab results as soon as they are available. The fastest way to get your results is to activate your My Chart account. Instructions are located on the last page of this paperwork. If you have not heard from us regarding the results in 2 weeks, please contact this office.         Edwina BarthMiguel Glendel Jaggers, MD [facility]

## 2018-12-23 NOTE — ED Provider Notes (Signed)
[facility]-EMERGENCY DEPT Provider Note   CSN: [id number] Arrival date & time: 12/23/18  1640    History   Chief Complaint Chief Complaint  Patient presents with  . Abdominal Pain    HPI Sue Allen is a 23 y.o. female.     HPI   Sue Allen is a 23 y.o. female, with a history of anemia, presenting to the ED with abdominal pain for the past 4 days.  Pain is in the lower abdomen, cramping, constant, worsening, 8/10, radiating across the lower abdomen.  Accompanied by fever and nausea.  She has not experienced these symptoms before.  She finished her menstrual cycle 4 days ago just prior to the onset of the symptoms. Patient was seen by PCP today and sent to the ED for rule out appendicitis.  Patient's temperature in the office 102F. Last bowel movement was earlier today and was normal.  Last food was dinner last night. Patient has both a female partner and a female partner.  She has been in sexual relationships with these partners for the last 3 months. Denies vomiting, diarrhea, constipation, dysuria, hematuria, abnormal vaginal bleeding or discharge, cough, shortness of breath, chest pain, flank/back pain, hematochezia/melena, or any other complaints.   Past Medical History:  Diagnosis Date  . Allergy   . Anemia     Patient Active Problem List   Diagnosis Date Noted  . Right lower quadrant abdominal pain 12/23/2018  . Nausea without vomiting 12/23/2018  . Appendicitis 12/23/2018    History reviewed. No pertinent surgical history.   OB History   No obstetric history on file.      Home Medications    Prior to Admission medications   Medication Sig Start Date End Date Taking? Authorizing Provider  Aspirin-Acetaminophen-Caffeine (PAMPRIN MAX PO) Take 1 tablet by mouth at bedtime as needed (pain).   Yes [provider]  escitalopram (LEXAPRO) 10 MG tablet Take 1 tablet (10 mg total) by mouth daily. After 2 weeks you can increase the  dose to 20mg  if needed. Patient taking differently: Take 10 mg by mouth daily.  07/25/18  Yes McVey, Madelaine Bhat, PA-C  ferrous sulfate 325 (65 FE) MG tablet Take 1 tablet (325 mg total) by mouth every other day. Monday, Wednesday and Friday. Take with 6 oz orange juice 05/29/18  Yes McVey, Madelaine Bhat, PA-C  fexofenadine (ALLEGRA) 180 MG tablet Take 180 mg by mouth daily.   Yes [provider]  levonorgestrel-ethinyl estradiol (AVIANE) 0.1-20 MG-MCG tablet Take 1 tablet by mouth daily. 05/23/18  Yes McVey, Madelaine Bhat, PA-C  naproxen sodium (ALEVE) 220 MG tablet Take 220 mg by mouth daily as needed (pain).   Yes [provider]  amphetamine-dextroamphetamine (ADDERALL XR) 20 MG 24 hr capsule Take 1 capsule (20 mg total) by mouth daily. 08/29/18 09/28/18  McVey, Madelaine Bhat, PA-C    Family History Family History  Problem Relation Age of Onset  . Diabetes Mother   . Hyperlipidemia Mother   . Hypertension Mother   . Hyperlipidemia Father   . Diabetes Maternal Grandmother   . Alzheimer's disease Maternal Grandmother   . Glaucoma Maternal Grandmother     Social History Social History   Tobacco Use  . Smoking status: Never Smoker  . Smokeless tobacco: Never Used  Substance Use Topics  . Alcohol use: Not Currently    Alcohol/week: 1.0 standard drinks    Types: 1 Standard drinks or equivalent per week  . Drug use: Yes  Types: Marijuana     Allergies   Patient has no known allergies.   Review of Systems Review of Systems  Constitutional: Positive for chills and fever.  Respiratory: Negative for cough and shortness of breath.   Cardiovascular: Negative for chest pain.  Gastrointestinal: Positive for abdominal pain and nausea. Negative for blood in stool, constipation, diarrhea and vomiting.  Genitourinary: Negative for dysuria, flank pain, hematuria, vaginal bleeding and vaginal discharge.  Musculoskeletal: Negative for back pain.   Neurological: Negative for syncope and weakness.  All other systems reviewed and are negative.    Physical Exam Updated Vital Signs BP (!) 127/93 (BP Location: Right Arm)   Pulse 67   Temp 98.1 F (36.7 C)   Resp 16   Ht  (1.651 m)   Wt 65.3 kg   LMP 12/16/2018   SpO2 96%   BMI 23.96 kg/m   Physical Exam Vitals signs and nursing note reviewed.  Constitutional:      General: She is not in acute distress.    Appearance: She is well-developed. She is not diaphoretic.  HENT:     Head: Normocephalic and atraumatic.     Mouth/Throat:     Mouth: Mucous membranes are moist.     Pharynx: Oropharynx is clear.  Eyes:     Conjunctiva/sclera: Conjunctivae normal.  Neck:     Musculoskeletal: Neck supple.  Cardiovascular:     Rate and Rhythm: Normal rate and regular rhythm.     Pulses: Normal pulses.     Heart sounds: Normal heart sounds.     Comments: Tactile temperature in the extremities appropriate and equal bilaterally. Pulmonary:     Effort: Pulmonary effort is normal. No respiratory distress.     Breath sounds: Normal breath sounds.  Abdominal:     Palpations: Abdomen is soft.     Tenderness: There is abdominal tenderness. There is no guarding.    Genitourinary:    Comments: External genitalia normal Vagina with discharge - Yellow-tinged discharge present Cervix  normal negative for cervical motion tenderness Adnexa palpated, no masses, positive for tenderness noted on the right. Bladder palpated negative for tenderness Uterus palpated no masses, positive for tenderness  No inguinal lymphadenopathy. Otherwise normal female genitalia. RN, Maralyn Sago, served as chaperone during exam. Musculoskeletal:     Right lower leg: No edema.     Left lower leg: No edema.  Lymphadenopathy:     Cervical: No cervical adenopathy.  Skin:    General: Skin is warm and dry.  Neurological:     Mental Status: She is alert.  Psychiatric:        Mood and Affect: Mood and affect  normal.        Speech: Speech normal.        Behavior: Behavior normal.      ED Treatments / Results  Labs (all labs ordered are listed, but only abnormal results are displayed) Labs Reviewed  COMPREHENSIVE METABOLIC PANEL - Abnormal; Notable for the following components:      Result Value   Total Protein 8.6 (*)    AST 14 (*)    All other components within normal limits  CBC - Abnormal; Notable for the following components:   WBC 19.6 (*)    MCH 25.7 (*)    RDW 15.7 (*)    All other components within normal limits  URINALYSIS, ROUTINE W REFLEX MICROSCOPIC - Abnormal; Notable for the following components:   APPearance HAZY (*)    Hgb urine dipstick  MODERATE (*)    Ketones, ur 20 (*)    Leukocytes,Ua SMALL (*)    Bacteria, UA RARE (*)    All other components within normal limits  DIFFERENTIAL - Abnormal; Notable for the following components:   Neutro Abs 17.0 (*)    All other components within normal limits  I-STAT BETA HCG BLOOD, ED ([facility], [facility], [facility] ONLY) - Abnormal; Notable for the following components:   I-stat hCG, quantitative 9.4 (*)    All other components within normal limits  WET PREP, GENITAL  LIPASE, BLOOD  PREGNANCY, URINE  RPR  HIV ANTIBODY (ROUTINE TESTING W REFLEX)  GC/CHLAMYDIA PROBE AMP ([place]) NOT AT [facility]    EKG None  Radiology Ct Abdomen Pelvis W Contrast  Result Date: 12/23/2018 CLINICAL DATA:  Mid abdominal pain over the last 4 days which is severe. Assess for possible appendicitis. EXAM: CT ABDOMEN AND PELVIS WITH CONTRAST TECHNIQUE: Multidetector CT imaging of the abdomen and pelvis was performed using the standard protocol following bolus administration of intravenous contrast. CONTRAST:  100mL OMNIPAQUE IOHEXOL 300 MG/ML  SOLN COMPARISON:  None. FINDINGS: Lower chest: Lung bases are clear. Hepatobiliary: The dome of the liver is excluded from the scan. Otherwise, the liver appears normal. No calcified gallstones. Pancreas: Normal Spleen: Normal  Adrenals/Urinary Tract: Adrenal glands are normal. Kidneys are normal. Bladder is normal. Stomach/Bowel: The cecum is very low within the pelvis. The anatomy is quite difficult in that region. I think I can identify the pelvis extending along the pelvic sidewall the way down to the called a sac. The appendix shows mild hyperemia of the wall which could go along with early appendicitis. There is some free fluid in the region and mild edema the pelvic fat. The differential diagnosis would be that pelvic inflammatory disease. Otherwise, no abnormal bowel finding is seen. Vascular/Lymphatic: Normal Reproductive: No specific CT finding. Other: Small amount of free fluid as noted above. Musculoskeletal: Negative IMPRESSION: Findings which are suspicious for appendicitis but not conclusive. The anatomy is difficult in this case. Cecum is very low lying. There is a small amount of free fluid in the pelvis and I think there is mild edema of the pelvic fat. I think the appendix can be seen extending down into the cul-de-sac along the right side. There is hyperemia of the wall suggesting early appendicitis. As noted above, the differential diagnosis does include pelvic inflammatory disease. Electronically Signed   By: Paulina FusiMark  Shogry M.D.   On: 12/23/2018 20:19    Procedures Pelvic exam Date/Time: 12/23/2018 8:48 PM Performed by: Anselm PancoastJoy, Aerie Donica C, PA-C Authorized by: Anselm PancoastJoy, Marcelina Mclaurin C, PA-C  Consent: Verbal consent obtained. Risks and benefits: risks, benefits and alternatives were discussed Consent given by: patient Patient understanding: patient states understanding of the procedure being performed Patient consent: the patient's understanding of the procedure matches consent given Patient identity confirmed: verbally with patient and provided demographic data Local anesthesia used: no  Anesthesia: Local anesthesia used: no  Sedation: Patient sedated: no  Patient tolerance: Patient tolerated the procedure well with no  immediate complications    (including critical care time)  Medications Ordered in ED Medications  sodium chloride (PF) 0.9 % injection (has no administration in time range)  cefTRIAXone (ROCEPHIN) 2 g in sodium chloride 0.9 % 100 mL IVPB (has no administration in time range)    And  metroNIDAZOLE (FLAGYL) IVPB 500 mg (has no administration in time range)  sodium chloride flush (NS) 0.9 % injection 3 mL (3 mLs Intravenous  Given 12/23/18 1725)  morphine 4 MG/ML injection 4 mg (4 mg Intravenous Given 12/23/18 1844)  ondansetron (ZOFRAN) injection 4 mg (4 mg Intravenous Given 12/23/18 1844)  iohexol (OMNIPAQUE) 300 MG/ML solution 100 mL (100 mLs Intravenous Contrast Given 12/23/18 1956)     Initial Impression / Assessment and Plan / ED Course  I have reviewed the triage vital signs and the nursing notes.  Pertinent labs & imaging results that were available during my care of the patient were reviewed by me and considered in my medical decision making (see chart for details).  Clinical Course as of Dec 22 2109  Mon Dec 23, 2018  2036 Spoke with Dr. Carolynne Edouard, general surgeon. Agrees with plan for admission, starting ceftriaxone and metronidazole, and keeping patient NPO.  He will admit the patient and will come evaluate her.  On our end, we will perform pelvic exam and testing related to PID.   [[facility]]  2100 Spoke with Dr. Karin Golden, reading radiologist. States he does not think the findings on CT suggest something like TOA or other intra-pelvic pathology other than a small possibility for PID. If it is PID, it appears to be early. However, leading diagnosis is appendicitis. He does not think there is additional imaging, such as pelvic US, that could help further clarify the case.  CT ABDOMEN PELVIS W CONTRAST [[facility]]    Clinical Course User Index [[facility]] Yash Cacciola C, PA-C       Patient presents with abdominal pain. Nontoxic-appearing. Leukocytosis present.  Febrile prior to arrival. CT findings suspicious  for appendicitis. Clue cells and WBCs on wet prep.  Early PID also a consideration, but appendicitis remains leading diagnosis. General surgery was consulted and admitted the patient.  Labs and radiological studies were personally reviewed by me.  Findings and plan of care discussed with Lorre Nick, MD.   Vitals:   12/23/18 1651 12/23/18 1725 12/23/18 1846 12/23/18 1930  BP:  116/83 123/66 112/76  Pulse:  89 88   Resp:  16 16   Temp:      SpO2:  100% 100%   Weight: 65.3 kg     Height:  (1.651 m)         Final Clinical Impressions(s) / ED Diagnoses   Final diagnoses:  Lower abdominal pain    ED Discharge Orders    None       Concepcion Living 12/23/18 2233    Lorre Nick, MD 12/24/18 2247

## 2018-12-24 ENCOUNTER — Encounter (HOSPITAL_COMMUNITY): Admission: EM | Disposition: A | Discharge status: Home / Self Care | Payer: Self-pay | Source: Home / Self Care

## 2018-12-24 ENCOUNTER — Observation Stay (HOSPITAL_COMMUNITY): Payer: BLUE CROSS/BLUE SHIELD | Admitting: Anesthesiology

## 2018-12-24 ENCOUNTER — Encounter (HOSPITAL_COMMUNITY): Payer: Self-pay | Admitting: Anesthesiology

## 2018-12-24 ENCOUNTER — Telehealth (HOSPITAL_COMMUNITY): Payer: Self-pay | Admitting: Emergency Medicine

## 2018-12-24 DIAGNOSIS — R1031 Right lower quadrant pain: Secondary | ICD-10-CM | POA: Diagnosis present

## 2018-12-24 DIAGNOSIS — Z79899 Other long term (current) drug therapy: Secondary | ICD-10-CM | POA: Diagnosis not present

## 2018-12-24 DIAGNOSIS — K353 Acute appendicitis with localized peritonitis, without perforation or gangrene: Secondary | ICD-10-CM | POA: Diagnosis present

## 2018-12-24 DIAGNOSIS — Z833 Family history of diabetes mellitus: Secondary | ICD-10-CM | POA: Diagnosis not present

## 2018-12-24 HISTORY — PX: LAPAROSCOPIC APPENDECTOMY: SHX408

## 2018-12-24 LAB — CBC
HCT: 34.1 % — ABNORMAL LOW (ref 36.0–46.0)
Hemoglobin: 10.7 g/dL — ABNORMAL LOW (ref 12.0–15.0)
MCH: 25.5 pg — ABNORMAL LOW (ref 26.0–34.0)
MCHC: 31.4 g/dL (ref 30.0–36.0)
MCV: 81.4 fL (ref 80.0–100.0)
Platelets: 219 10*3/uL (ref 150–400)
RBC: 4.19 MIL/uL (ref 3.87–5.11)
RDW: 15.7 % — ABNORMAL HIGH (ref 11.5–15.5)
WBC: 13.7 10*3/uL — ABNORMAL HIGH (ref 4.0–10.5)
nRBC: 0 % (ref 0.0–0.2)

## 2018-12-24 LAB — COMPREHENSIVE METABOLIC PANEL
ALT: 9 IU/L (ref 0–32)
AST: 13 IU/L (ref 0–40)
Albumin/Globulin Ratio: 1.4 (ref 1.2–2.2)
Albumin: 4.5 g/dL (ref 3.9–5.0)
Alkaline Phosphatase: 65 IU/L (ref 39–117)
BUN/Creatinine Ratio: 9 (ref 9–23)
BUN: 7 mg/dL (ref 6–20)
Bilirubin Total: 0.6 mg/dL (ref 0.0–1.2)
CO2: 22 mmol/L (ref 20–29)
Calcium: 9.6 mg/dL (ref 8.7–10.2)
Chloride: 99 mmol/L (ref 96–106)
Creatinine, Ser: 0.78 mg/dL (ref 0.57–1.00)
GFR calc Af Amer: 124 mL/min/{1.73_m2} (ref >59)
GFR calc non Af Amer: 107 mL/min/{1.73_m2} (ref >59)
Globulin, Total: 3.2 g/dL (ref 1.5–4.5)
Glucose: 95 mg/dL (ref 65–99)
Potassium: 4 mmol/L (ref 3.5–5.2)
Sodium: 136 mmol/L (ref 134–144)
Total Protein: 7.7 g/dL (ref 6.0–8.5)

## 2018-12-24 LAB — BASIC METABOLIC PANEL
Anion gap: 10 (ref 5–15)
BUN: 7 mg/dL (ref 6–20)
CO2: 22 mmol/L (ref 22–32)
Calcium: 8.7 mg/dL — ABNORMAL LOW (ref 8.9–10.3)
Chloride: 104 mmol/L (ref 98–111)
Creatinine, Ser: 0.73 mg/dL (ref 0.44–1.00)
GFR calc Af Amer: >60 mL/min (ref >60)
GFR calc non Af Amer: >60 mL/min (ref >60)
Glucose, Bld: 109 mg/dL — ABNORMAL HIGH (ref 70–99)
Potassium: 3 mmol/L — ABNORMAL LOW (ref 3.5–5.1)
Sodium: 136 mmol/L (ref 135–145)

## 2018-12-24 LAB — GC/CHLAMYDIA PROBE AMP (~~LOC~~) NOT AT ARMC
Chlamydia: NEGATIVE
Neisseria Gonorrhea: POSITIVE — AB

## 2018-12-24 LAB — RPR: RPR Ser Ql: NONREACTIVE

## 2018-12-24 LAB — HIV ANTIBODY (ROUTINE TESTING W REFLEX): HIV Screen 4th Generation wRfx: NONREACTIVE

## 2018-12-24 LAB — HCG, QUANTITATIVE, PREGNANCY: hCG, Beta Chain, Quant, S: <1 m[IU]/mL (ref <5)

## 2018-12-24 SURGERY — APPENDECTOMY, LAPAROSCOPIC
Anesthesia: General

## 2018-12-24 MED ORDER — ROCURONIUM BROMIDE 100 MG/10ML IV SOLN
INTRAVENOUS | Status: AC
  Filled 2018-12-24: qty 1

## 2018-12-24 MED ORDER — PROMETHAZINE HCL 25 MG/ML IJ SOLN: 6.2500 mg | INTRAMUSCULAR | Status: DC | PRN

## 2018-12-24 MED ORDER — SUGAMMADEX SODIUM 200 MG/2ML IV SOLN
INTRAVENOUS | Status: DC | PRN
  Administered 2018-12-24: 180 mg via INTRAVENOUS

## 2018-12-24 MED ORDER — MORPHINE SULFATE (PF) 4 MG/ML IV SOLN: 2.0000 mg | INTRAVENOUS | Status: DC | PRN

## 2018-12-24 MED ORDER — KETOROLAC TROMETHAMINE 30 MG/ML IJ SOLN: 30.0000 mg | Freq: Once | INTRAMUSCULAR | Status: DC | PRN

## 2018-12-24 MED ORDER — PROPOFOL 10 MG/ML IV BOLUS
INTRAVENOUS | Status: DC | PRN
  Administered 2018-12-24: 180 mg via INTRAVENOUS

## 2018-12-24 MED ORDER — ONDANSETRON HCL 4 MG/2ML IJ SOLN
INTRAMUSCULAR | Status: DC | PRN
  Administered 2018-12-24: 4 mg via INTRAVENOUS

## 2018-12-24 MED ORDER — ONDANSETRON HCL 4 MG/2ML IJ SOLN
INTRAMUSCULAR | Status: AC
  Filled 2018-12-24: qty 2

## 2018-12-24 MED ORDER — FENTANYL CITRATE (PF) 100 MCG/2ML IJ SOLN: 25.0000 ug | INTRAMUSCULAR | Status: DC | PRN

## 2018-12-24 MED ORDER — ENOXAPARIN SODIUM 40 MG/0.4ML ~~LOC~~ SOLN
40.0000 mg | SUBCUTANEOUS | Status: DC
  Administered 2018-12-25: 06:00:00 40 mg via SUBCUTANEOUS
  Filled 2018-12-24: qty 0.4

## 2018-12-24 MED ORDER — DEXAMETHASONE SODIUM PHOSPHATE 10 MG/ML IJ SOLN
INTRAMUSCULAR | Status: AC
  Filled 2018-12-24: qty 1

## 2018-12-24 MED ORDER — LACTATED RINGERS IV SOLN
INTRAVENOUS | Status: DC
  Administered 2018-12-24: 14:00:00 via INTRAVENOUS

## 2018-12-24 MED ORDER — CEFAZOLIN SODIUM-DEXTROSE 2-4 GM/100ML-% IV SOLN
INTRAVENOUS | Status: AC
  Filled 2018-12-24: qty 100

## 2018-12-24 MED ORDER — LACTATED RINGERS IR SOLN
Status: DC | PRN
  Administered 2018-12-24: 1000 mL

## 2018-12-24 MED ORDER — BUPIVACAINE-EPINEPHRINE (PF) 0.25% -1:200000 IJ SOLN
INTRAMUSCULAR | Status: AC
  Filled 2018-12-24: qty 30

## 2018-12-24 MED ORDER — SODIUM CHLORIDE 0.9 % IV SOLN
INTRAVENOUS | Status: DC
  Administered 2018-12-24: 17:00:00 via INTRAVENOUS

## 2018-12-24 MED ORDER — FENTANYL CITRATE (PF) 100 MCG/2ML IJ SOLN
INTRAMUSCULAR | Status: DC | PRN
  Administered 2018-12-24 (×3): 50 ug via INTRAVENOUS
  Administered 2018-12-24: 100 ug via INTRAVENOUS

## 2018-12-24 MED ORDER — ROCURONIUM BROMIDE 100 MG/10ML IV SOLN
INTRAVENOUS | Status: DC | PRN
  Administered 2018-12-24: 30 mg via INTRAVENOUS

## 2018-12-24 MED ORDER — BUPIVACAINE-EPINEPHRINE 0.25% -1:200000 IJ SOLN
INTRAMUSCULAR | Status: DC | PRN
  Administered 2018-12-24: 17 mL

## 2018-12-24 MED ORDER — FENTANYL CITRATE (PF) 100 MCG/2ML IJ SOLN
INTRAMUSCULAR | Status: AC
  Filled 2018-12-24: qty 2

## 2018-12-24 MED ORDER — PROPOFOL 10 MG/ML IV BOLUS
INTRAVENOUS | Status: AC
  Filled 2018-12-24: qty 20

## 2018-12-24 MED ORDER — SUCCINYLCHOLINE CHLORIDE 200 MG/10ML IV SOSY
PREFILLED_SYRINGE | INTRAVENOUS | Status: AC
  Filled 2018-12-24: qty 10

## 2018-12-24 MED ORDER — MIDAZOLAM HCL 2 MG/2ML IJ SOLN
INTRAMUSCULAR | Status: AC
  Filled 2018-12-24: qty 2

## 2018-12-24 MED ORDER — LIP MEDEX EX OINT
TOPICAL_OINTMENT | CUTANEOUS | Status: AC
  Administered 2018-12-24: 17:00:00
  Filled 2018-12-24: qty 7

## 2018-12-24 MED ORDER — MIDAZOLAM HCL 5 MG/5ML IJ SOLN
INTRAMUSCULAR | Status: DC | PRN
  Administered 2018-12-24: 2 mg via INTRAVENOUS

## 2018-12-24 MED ORDER — FENTANYL CITRATE (PF) 250 MCG/5ML IJ SOLN
INTRAMUSCULAR | Status: AC
  Filled 2018-12-24: qty 5

## 2018-12-24 MED ORDER — SUCCINYLCHOLINE CHLORIDE 20 MG/ML IJ SOLN
INTRAMUSCULAR | Status: DC | PRN
  Administered 2018-12-24: 100 mg via INTRAVENOUS

## 2018-12-24 MED ORDER — OXYCODONE HCL 5 MG PO TABS
5.0000 mg | ORAL_TABLET | ORAL | Status: DC | PRN
  Administered 2018-12-24 – 2018-12-25 (×3): 10 mg via ORAL
  Filled 2018-12-24 (×3): qty 2

## 2018-12-24 MED ORDER — LIDOCAINE HCL (CARDIAC) PF 100 MG/5ML IV SOSY
PREFILLED_SYRINGE | INTRAVENOUS | Status: DC | PRN
  Administered 2018-12-24: 60 mg via INTRAVENOUS

## 2018-12-24 MED ORDER — PHENYLEPHRINE 40 MCG/ML (10ML) SYRINGE FOR IV PUSH (FOR BLOOD PRESSURE SUPPORT)
PREFILLED_SYRINGE | INTRAVENOUS | Status: AC
  Filled 2018-12-24: qty 20

## 2018-12-24 SURGICAL SUPPLY — 47 items
APPLIER CLIP 5 13 M/L LIGAMAX5 (MISCELLANEOUS)
APPLIER CLIP ROT 10 11.4 M/L (STAPLE)
CABLE HIGH FREQUENCY MONO STRZ (ELECTRODE) ×3 IMPLANT
CHLORAPREP W/TINT 26 (MISCELLANEOUS) ×3 IMPLANT
CLIP APPLIE 5 13 M/L LIGAMAX5 (MISCELLANEOUS) IMPLANT
CLIP APPLIE ROT 10 11.4 M/L (STAPLE) IMPLANT
COVER SURGICAL LIGHT HANDLE (MISCELLANEOUS) ×3 IMPLANT
COVER WAND RF STERILE (DRAPES) IMPLANT
CUTTER FLEX LINEAR 45M (STAPLE) ×2 IMPLANT
DECANTER SPIKE VIAL GLASS SM (MISCELLANEOUS) ×3 IMPLANT
DERMABOND ADVANCED (GAUZE/BANDAGES/DRESSINGS) ×2
DERMABOND ADVANCED .7 DNX12 (GAUZE/BANDAGES/DRESSINGS) ×1 IMPLANT
DRAIN CHANNEL 19F RND (DRAIN) IMPLANT
ELECT REM PT RETURN 15FT ADLT (MISCELLANEOUS) ×3 IMPLANT
ENDOLOOP SUT PDS II  0 18 (SUTURE)
ENDOLOOP SUT PDS II 0 18 (SUTURE) IMPLANT
EVACUATOR SILICONE 100CC (DRAIN) IMPLANT
GLOVE BIO SURGEON STRL SZ 6 (GLOVE) ×3 IMPLANT
GLOVE INDICATOR 6.5 STRL GRN (GLOVE) ×3 IMPLANT
GOWN STRL REUS W/ TWL LRG LVL3 (GOWN DISPOSABLE) ×1 IMPLANT
GOWN STRL REUS W/TWL LRG LVL3 (GOWN DISPOSABLE) ×2
GOWN STRL REUS W/TWL XL LVL3 (GOWN DISPOSABLE) ×3 IMPLANT
GRASPER SUT TROCAR 14GX15 (MISCELLANEOUS) ×2 IMPLANT
IRRIG SUCT STRYKERFLOW 2 WTIP (MISCELLANEOUS) ×3
IRRIGATION SUCT STRKRFLW 2 WTP (MISCELLANEOUS) ×1 IMPLANT
KIT BASIN OR (CUSTOM PROCEDURE TRAY) ×3 IMPLANT
KIT TURNOVER KIT A (KITS) ×2 IMPLANT
NDL INSUFFLATION 14GA 120MM (NEEDLE) ×1 IMPLANT
NEEDLE INSUFFLATION 14GA 120MM (NEEDLE) ×3 IMPLANT
POUCH SPECIMEN RETRIEVAL 10MM (ENDOMECHANICALS) ×3 IMPLANT
RELOAD 45 VASCULAR/THIN (ENDOMECHANICALS) IMPLANT
RELOAD STAPLE 45 2.5 WHT GRN (ENDOMECHANICALS) IMPLANT
RELOAD STAPLE 45 3.5 BLU ETS (ENDOMECHANICALS) IMPLANT
RELOAD STAPLE TA45 3.5 REG BLU (ENDOMECHANICALS) ×3 IMPLANT
SCISSORS LAP 5X35 DISP (ENDOMECHANICALS) ×3 IMPLANT
SET TUBE SMOKE EVAC HIGH FLOW (TUBING) ×3 IMPLANT
SHEARS HARMONIC ACE PLUS 36CM (ENDOMECHANICALS) ×2 IMPLANT
SLEEVE XCEL OPT CAN 5 100 (ENDOMECHANICALS) ×3 IMPLANT
SUT ETHILON 2 0 PS N (SUTURE) IMPLANT
SUT MNCRL AB 4-0 PS2 18 (SUTURE) ×3 IMPLANT
TOWEL OR 17X26 10 PK STRL BLUE (TOWEL DISPOSABLE) ×3 IMPLANT
TOWEL OR NON WOVEN STRL DISP B (DISPOSABLE) ×3 IMPLANT
TRAY FOLEY MTR SLVR 14FR STAT (SET/KITS/TRAYS/PACK) ×2 IMPLANT
TRAY FOLEY MTR SLVR 16FR STAT (SET/KITS/TRAYS/PACK) IMPLANT
TRAY LAPAROSCOPIC (CUSTOM PROCEDURE TRAY) ×3 IMPLANT
TROCAR BLADELESS OPT 5 100 (ENDOMECHANICALS) ×3 IMPLANT
TROCAR XCEL 12X100 BLDLESS (ENDOMECHANICALS) ×3 IMPLANT

## 2018-12-24 NOTE — Anesthesia Preprocedure Evaluation (Signed)
Anesthesia Evaluation  Patient identified by MRN, date of birth, ID band Patient awake    Reviewed: Allergy & Precautions, NPO status , Patient's Chart, lab work & pertinent test results  Airway Mallampati: II  TM Distance: >3 FB Neck ROM: Full    Dental no notable dental hx.    Pulmonary neg pulmonary ROS,    Pulmonary exam normal breath sounds clear to auscultation       Cardiovascular negative cardio ROS Normal cardiovascular exam Rhythm:Regular Rate:Normal     Neuro/Psych negative neurological ROS  negative psych ROS   GI/Hepatic negative GI ROS, Neg liver ROS,   Endo/Other  negative endocrine ROS  Renal/GU negative Renal ROS  negative genitourinary   Musculoskeletal negative musculoskeletal ROS (+)   Abdominal   Peds negative pediatric ROS (+)  Hematology  (+) anemia ,   Anesthesia Other Findings   Reproductive/Obstetrics negative OB ROS                             Anesthesia Physical Anesthesia Plan  ASA: II  Anesthesia Plan: General   Post-op Pain Management:    Induction: Intravenous and Rapid sequence  PONV Risk Score and Plan: 3 and Ondansetron, Dexamethasone and Treatment may vary due to age or medical condition  Airway Management Planned: Oral ETT  Additional Equipment:   Intra-op Plan:   Post-operative Plan: Extubation in OR  Informed Consent: I have reviewed the patients History and Physical, chart, labs and discussed the procedure including the risks, benefits and alternatives for the proposed anesthesia with the patient or authorized representative who has indicated his/her understanding and acceptance.     Dental advisory given  Plan Discussed with: CRNA and Surgeon  Anesthesia Plan Comments:         Anesthesia Quick Evaluation

## 2018-12-24 NOTE — Transfer of Care (Signed)
Immediate Anesthesia Transfer of Care Note  Patient: Sue Allen  Procedure(s) Performed: APPENDECTOMY LAPAROSCOPIC (N/A )  Patient Location: PACU  Anesthesia Type:General  Level of Consciousness: awake, alert , oriented and patient cooperative  Airway & Oxygen Therapy: Patient Spontanous Breathing and Patient connected to face mask oxygen  Post-op Assessment: Report given to RN and Post -op Vital signs reviewed and stable  Post vital signs: Reviewed and stable  Last Vitals:  Vitals Value Taken Time  BP 119/79 12/24/2018  3:17 PM  Temp 36.4 C 12/24/2018  3:17 PM  Pulse 105 12/24/2018  3:23 PM  Resp 24 12/24/2018  3:23 PM  SpO2 98 % 12/24/2018  3:23 PM  Vitals shown include unvalidated device data.  Last Pain:  Vitals:   12/24/18 1517  TempSrc:   PainSc: 0-No pain      Patients Stated Pain Goal: 3 (12/24/18 1203)  Complications: No apparent anesthesia complications

## 2018-12-24 NOTE — Anesthesia Procedure Notes (Signed)
Procedure Name: Intubation Date/Time: 12/24/2018 1:59 PM Performed by: Thornell Mule, CRNA Pre-anesthesia Checklist: Patient identified, Emergency Drugs available, Suction available and Patient being monitored Patient Re-evaluated:Patient Re-evaluated prior to induction Oxygen Delivery Method: Circle system utilized Preoxygenation: Pre-oxygenation with 100% oxygen Induction Type: IV induction and Rapid sequence Laryngoscope Size: Miller and 3 Grade View: Grade I Tube type: Oral Tube size: 7.0 mm Number of attempts: 1 Airway Equipment and Method: Stylet and Oral airway Placement Confirmation: ETT inserted through vocal cords under direct vision,  positive ETCO2 and breath sounds checked- equal and bilateral Secured at: 20 cm Tube secured with: Tape Dental Injury: Teeth and Oropharynx as per pre-operative assessment

## 2018-12-24 NOTE — Telephone Encounter (Signed)
I noted patient's gonorrhea testing came back positive.  Her wet prep was positive for clue cells.  Her initial differential diagnosis included acute appendicitis versus PID.  Due to the above abnormal lab results, it may behoove the patient for her to be treated for PID upon her discharge.  As she is still under the care of the general surgery team, a message was sent to Dr. Doylene Canard, general surgeon who performed the patient's appendectomy today.

## 2018-12-24 NOTE — Progress Notes (Signed)
Patient ID: Sue Allen, female   DOB: 12-28-1995, 23 y.o.   MRN: [id number]    Day of Surgery  Subjective: Patient feels the best she has felt in 4 days.  Still with some RLQ abdominal pain, but very well controlled.  Nausea has resolved.  Objective: Vital signs in last 24 hours: Temp:  [98.1 F (36.7 C)-102.1 F (38.9 C)] 98.5 F (36.9 C) (04/07 0607) Pulse Rate:  [40-98] 79 (04/07 0607) Resp:  [16-18] 16 (04/07 0607) BP: (102-127)/(62-93) 103/66 (04/07 0607) SpO2:  [96 %-100 %] 100 % (04/07 0607) Weight:  [65.3 kg-65.4 kg] 65.3 kg (04/06 1651) Last BM Date: 12/23/18  Intake/Output from previous day: 04/06 0701 - 04/07 0700 In: 789.5 [I.V.:589.5; IV Piggyback:200] Out: 400 [Urine:400] Intake/Output this shift: No intake/output data recorded.  PE: Heart: regular Lungs: CTAB Abd: soft, mildly tender in RLQ, +BS, ND  Lab Results:  Recent Labs    12/23/18 1741 12/24/18 0321  WBC 19.6* 13.7*  HGB 12.5 10.7*  HCT 39.7 34.1*  PLT 246 219   BMET Recent Labs    12/23/18 1741 12/24/18 0321  NA 135 136  K 3.6 3.0*  CL 101 104  CO2 23 22  GLUCOSE 93 109*  BUN 8 7  CREATININE 0.76 0.73  CALCIUM 9.4 8.7*   PT/INR No results for input(s): LABPROT, INR in the last 72 hours. CMP     Component Value Date/Time   NA 136 12/24/2018 0321   NA 136 12/23/2018 1633   K 3.0 (L) 12/24/2018 0321   CL 104 12/24/2018 0321   CO2 22 12/24/2018 0321   GLUCOSE 109 (H) 12/24/2018 0321   BUN 7 12/24/2018 0321   BUN 7 12/23/2018 1633   CREATININE 0.73 12/24/2018 0321   CALCIUM 8.7 (L) 12/24/2018 0321   PROT 8.6 (H) 12/23/2018 1741   PROT 7.7 12/23/2018 1633   ALBUMIN 4.2 12/23/2018 1741   ALBUMIN 4.5 12/23/2018 1633   AST 14 (L) 12/23/2018 1741   ALT 11 12/23/2018 1741   ALKPHOS 63 12/23/2018 1741   BILITOT 0.8 12/23/2018 1741   BILITOT 0.6 12/23/2018 1633   GFRNONAA >60 12/24/2018 0321   GFRAA >60 12/24/2018 0321   Lipase     Component Value Date/Time   LIPASE 23  12/23/2018 1741       Studies/Results: Ct Abdomen Pelvis W Contrast  Result Date: 12/23/2018 CLINICAL DATA:  Mid abdominal pain over the last 4 days which is severe. Assess for possible appendicitis. EXAM: CT ABDOMEN AND PELVIS WITH CONTRAST TECHNIQUE: Multidetector CT imaging of the abdomen and pelvis was performed using the standard protocol following bolus administration of intravenous contrast. CONTRAST:  OMNIPAQUE IOHEXOL 300 MG/ML  SOLN COMPARISON:  None. FINDINGS: Lower chest: Lung bases are clear. Hepatobiliary: The dome of the liver is excluded from the scan. Otherwise, the liver appears normal. No calcified gallstones. Pancreas: Normal Spleen: Normal Adrenals/Urinary Tract: Adrenal glands are normal. Kidneys are normal. Bladder is normal. Stomach/Bowel: The cecum is very low within the pelvis. The anatomy is quite difficult in that region. I think I can identify the pelvis extending along the pelvic sidewall the way down to the called a sac. The appendix shows mild hyperemia of the wall which could go along with early appendicitis. There is some free fluid in the region and mild edema the pelvic fat. The differential diagnosis would be that pelvic inflammatory disease. Otherwise, no abnormal bowel finding is seen. Vascular/Lymphatic: Normal Reproductive: No specific CT finding. Other: Small  amount of free fluid as noted above. Musculoskeletal: Negative IMPRESSION: Findings which are suspicious for appendicitis but not conclusive. The anatomy is difficult in this case. Cecum is very low lying. There is a small amount of free fluid in the pelvis and I think there is mild edema of the pelvic fat. I think the appendix can be seen extending down into the cul-de-sac along the right side. There is hyperemia of the wall suggesting early appendicitis. As noted above, the differential diagnosis does include pelvic inflammatory disease. Electronically Signed   By: Paulina FusiMark  Shogry M.D.   On: 12/23/2018  20:19    Anti-infectives: Anti-infectives (From admission, onward)   Start     Dose/Rate Route Frequency Ordered Stop   12/24/18 2200  cefTRIAXone (ROCEPHIN) 2 g in sodium chloride 0.9 % 100 mL IVPB     2 g 200 mL/hr over 30 Minutes Intravenous Every 24 hours 12/23/18 2159     12/23/18 2230  metroNIDAZOLE (FLAGYL) IVPB 500 mg     500 mg 100 mL/hr over 60 Minutes Intravenous Every 8 hours 12/23/18 2159     12/23/18 2045  cefTRIAXone (ROCEPHIN) 2 g in sodium chloride 0.9 % 100 mL IVPB     2 g 200 mL/hr over 30 Minutes Intravenous  Once 12/23/18 2037 12/23/18 2139   12/23/18 2045  metroNIDAZOLE (FLAGYL) IVPB 500 mg     500 mg 100 mL/hr over 60 Minutes Intravenous  Once 12/23/18 2037         Assessment/Plan RLQ abdominal pain -acute appendicitis vs PID -plan for OR later today for diagnostic laparoscopy with appendectomy as this can not be ruled out by her CT scan. -fever overnight of 102, improved with tylenol -cont NPO  -I have discussed the procedure and risks of appendectomy. The risks include but are not limited to bleeding, infection, wound problems, anesthesia, injury to intra-abdominal organs, possibility of postoperative ileus. She seems to understand and agrees with the plan.    FEN - NPO VTE - SCDs ID - Rocephin/Flagyl   LOS: 0 days    Letha CapeKelly E Ashlin Hidalgo , [facility] 12/24/2018, 8:36 AM Pager: [phone]

## 2018-12-24 NOTE — Op Note (Signed)
Operative Report  Sue Allen 23 y.o. female  [id number]  [id number]  12/24/2018  Surgeon: Berna Bue MD  Assistant: none  Procedure performed: Laparoscopic Appendectomy  Preop diagnosis: Acute appendicitis  Post-op diagnosis/intraop findings: Acute appendicitis - with localized peritonitis   Specimens: appendix  EBL: minimal  Complications: none  Description of procedure: After obtaining informed consent the patient was brought to the operating room. Antibiotics and subcutaneous heparin were administered. SCD's were applied. General endotracheal anesthesia was initiated and a formal time-out was performed. Foley inserted which is removed at the end of the case. The abdomen was prepped and draped in the usual sterile fashion and the abdomen was entered using an infraumbilical Veress needle and insufflated to 15 mmHg. A 5 mm trocar and camera were then introduced, the abdomen was inspected and there is no evidence of injury from our entry. A suprapubic 5 mm trocar and a left lower quadrant 12 mm trocar were introduced under direct visualization following infiltration with local. The patient was then placed in Trendelenburg and rotated to the left and the small bowel was reflected cephalad. The appendix was visualized: It is long and edematous with a purulent rind on the distal half, with a small amount of purulent fluid in the pelvis. The uterus and adnexa appeared grossly normal. A combination of blunt dissection and harmonic were used to free the appendix of its retroperitoneal attachments. Great care was taken to ensure no injury to surrounding retroperitoneal structures, cecum or terminal ileum. A window was created at the base of the appendix and a blue load linear cutting stapler was used to transect the appendix from the cecum, taking a small cuff of cecum with the specimen. The harmonic scalpel was then used to transect the appendiceal mesentery. There was some oozing on  the medial staple line which was addressed with clips and direct pressure. Hemostasis was ensured. The appendix was removed through our 12 mm trocar. The purulent fluid in the pelvis was aspirated and the right lower quadrant and pelvis were irrigated and aspirated, the effluent was clear. The omentum is brought down to cover the staple line. The 10mm trocar site in the left lower quadrant was closed with a 0 vicryl in the fascia under direct visualization using a PMI device. The abdomen was desufflated and all trocars removed. The skin incisions were closed with running subcuticular monocryl and Dermabond. The patient was awakened, extubated and transported to the recovery room in stable condition.   All counts were correct at the completion of the case.

## 2018-12-25 ENCOUNTER — Other Ambulatory Visit: Payer: Self-pay | Admitting: Physician Assistant

## 2018-12-25 ENCOUNTER — Encounter (HOSPITAL_COMMUNITY): Payer: Self-pay | Admitting: Surgery

## 2018-12-25 DIAGNOSIS — F411 Generalized anxiety disorder: Secondary | ICD-10-CM

## 2018-12-25 DIAGNOSIS — A549 Gonococcal infection, unspecified: Secondary | ICD-10-CM

## 2018-12-25 LAB — CBC
HCT: 36.9 % (ref 36.0–46.0)
Hemoglobin: 11.3 g/dL — ABNORMAL LOW (ref 12.0–15.0)
MCH: 25.1 pg — ABNORMAL LOW (ref 26.0–34.0)
MCHC: 30.6 g/dL (ref 30.0–36.0)
MCV: 82 fL (ref 80.0–100.0)
Platelets: 240 10*3/uL (ref 150–400)
RBC: 4.5 MIL/uL (ref 3.87–5.11)
RDW: 15.4 % (ref 11.5–15.5)
WBC: 13.3 10*3/uL — ABNORMAL HIGH (ref 4.0–10.5)
nRBC: 0 % (ref 0.0–0.2)

## 2018-12-25 MED ORDER — ONDANSETRON 4 MG PO TBDP: 4.0000 mg | ORAL_TABLET | Freq: Four times a day (QID) | ORAL | 0 refills | Status: DC | PRN

## 2018-12-25 MED ORDER — METRONIDAZOLE 500 MG PO TABS: 500.0000 mg | ORAL_TABLET | Freq: Two times a day (BID) | ORAL | 0 refills | Status: DC

## 2018-12-25 MED ORDER — OXYCODONE HCL 5 MG PO TABS: 5.0000 mg | ORAL_TABLET | Freq: Four times a day (QID) | ORAL | 0 refills | Status: DC | PRN

## 2018-12-25 MED ORDER — DOCUSATE SODIUM 100 MG PO CAPS: 100.0000 mg | ORAL_CAPSULE | Freq: Every day | ORAL | 2 refills | Status: DC | PRN

## 2018-12-25 MED ORDER — DOXYCYCLINE HYCLATE 100 MG PO CAPS: 100.0000 mg | ORAL_CAPSULE | Freq: Two times a day (BID) | ORAL | 0 refills | Status: DC

## 2018-12-25 NOTE — Discharge Summary (Signed)
Patient ID: Sue Allen [id number] [birth year] 23 y.o.  Admit date: 12/23/2018 Discharge date: 12/25/2018  Admitting Diagnosis: Acute appendicitis  Discharge Diagnosis Patient Active Problem List   Diagnosis Date Noted  . Gonorrhea 12/25/2018  . Right lower quadrant abdominal pain 12/23/2018  . Nausea without vomiting 12/23/2018  . Appendicitis with peritonitis s/p lap appendectomy 12/24/2018 12/23/2018    Consultants Pharmacy, [facility]  Procedures Laparoscopic Appendectomy, Dr. Phylliss Blakeshelsea Connor, 4/72020  Hospital Course:  Sue Monksyana Stepanian is a 23 y.o. female who [facility] after being sent by her PCP for lower abdominal pain, fever and nausea. On workup patient CT concerning for appendicitis vs PID. WBC noted to be 19. Patient was admitted to general surgery service. Patient had persistent tenderness of the RLQ the following morning and developed a 102 fever overnight. She was taken to the OR for diagnostic laparoscopy with appendectomy given this could not be ruled out by her CT scan.  The patient tolerated the procedure well.   Patient also tested positive for Gonorrhea and Wet Prep is consistent with BV. On further questioning, patient does engage in non-protected intercourse with one female and one female. Will cover for PID with Doxycyclie BID and Flagyl BID for 14 days. I have discussed with pharmacy, Jill [facility]ersJustin, that the Rocephin she has received IV already is sufficent to cover for initial IM dose usually given for PID coverage. No IM dose indicated prior to discharge. Patient understands that all sexual partners in the last 6 months need to be notified so they can be tested/treated. Patient understands to refrain from sexual activity while being treated, and all partners must be tested/treated. Advised safe sex practices. Recommended follow up with pcp vs obgyn. Information given for the health department.   On POD 1, the patient was tolerating a regular diet, voiding well,  mobilizing, and pain was controlled with oral pain medications.  The patient was stable for DC home at this time with appropriate follow up made. Note was given for school.   Physical Exam: Gen: Awake and alert, NAD Heart: RRR Lungs: Normal effort, CTA b/l Abd: Soft, ND, appropriately tender around laparoscopic incisions. Otherwise non-tender to palpation.   Allergies as of 12/25/2018   No Known Allergies     Medication List    TAKE these medications   amphetamine-dextroamphetamine 20 MG 24 hr capsule Commonly known as:  ADDERALL XR Take 1 capsule (20 mg total) by mouth daily.   docusate sodium 100 MG capsule Commonly known as:  Colace Take 1 capsule (100 mg total) by mouth daily as needed.   doxycycline 100 MG capsule Commonly known as:  VIBRAMYCIN Take 1 capsule (100 mg total) by mouth 2 (two) times daily.   escitalopram 10 MG tablet Commonly known as:  Lexapro Take 1 tablet (10 mg total) by mouth daily. After 2 weeks you can increase the dose to 20mg  if needed. What changed:  additional instructions   ferrous sulfate 325 (65 FE) MG tablet Take 1 tablet (325 mg total) by mouth every other day. Monday, Wednesday and Friday. Take with 6 oz orange juice   fexofenadine 180 MG tablet Commonly known as:  ALLEGRA Take 180 mg by mouth daily.   levonorgestrel-ethinyl estradiol 0.1-20 MG-MCG tablet Commonly known as:  Aviane Take 1 tablet by mouth daily.   metroNIDAZOLE 500 MG tablet Commonly known as:  Flagyl Take 1 tablet (500 mg total) by mouth 2 (two) times daily with a meal. DO NOT CONSUME ALCOHOL WHILE TAKING THIS  MEDICATION.   naproxen sodium 220 MG tablet Commonly known as:  ALEVE Take 220 mg by mouth daily as needed (pain).   ondansetron 4 MG disintegrating tablet Commonly known as:  ZOFRAN-ODT Take 1 tablet (4 mg total) by mouth every 6 (six) hours as needed for nausea.   oxyCODONE 5 MG immediate release tablet Commonly known as:  Oxy IR/ROXICODONE Take 1  tablet (5 mg total) by mouth every 6 (six) hours as needed.   PAMPRIN MAX PO Take 1 tablet by mouth at bedtime as needed (pain).        Follow-up Information    [facility], [facility].   Specialty:  General Surgery Why:  4/28 @ 845. Due to the coronavirus, this will be a televisit. The PA will call you at the time of your appointment. Please email a photo of your incisions and your name to [email or web address] a few days before your appointment.  Contact information: [address] [place] [address] [phone]        Department, [facility] Follow up.   Why:  Or your pcp in regards to your + gonorrhea result Contact information: [address] [place] [address] [phone]           Signed: Leary Roca, [facility] 12/25/2018, 10:01 AM Pager: [phone]

## 2018-12-25 NOTE — Progress Notes (Signed)
12/25/2018  1400  Reviewed discharge instructions with patient. Patient verbalized understanding of discharge instructions. Copy of discharge instructions and school/work note given to patient.

## 2018-12-25 NOTE — Discharge Instructions (Signed)
[facility] ° °Please arrive at least 30 min before your appointment to complete your check in paperwork.  If you are unable to arrive 30 min prior to your appointment time we may have to cancel or reschedule you. °LAPAROSCOPIC SURGERY: POST OP INSTRUCTIONS °Always review your discharge instruction sheet given to you by the facility where your surgery was performed. °IF YOU HAVE DISABILITY OR FAMILY LEAVE FORMS, YOU MUST BRING THEM TO THE OFFICE FOR PROCESSING.   °DO NOT GIVE THEM TO YOUR DOCTOR. ° °PAIN CONTROL ° °1. First take acetaminophen (Tylenol) AND/or ibuprofen (Advil) to control your pain after surgery.  Follow directions on package.  Taking acetaminophen (Tylenol) and/or ibuprofen (Advil) regularly after surgery will help to control your pain and lower the amount of prescription pain medication you may need.  You should not take more than 4,000 mg (4 grams) of acetaminophen (Tylenol) in 24 hours.  You should not take ibuprofen (Advil), aleve, motrin, naprosyn or other NSAIDS if you have a history of stomach ulcers or chronic kidney disease.  °2. A prescription for pain medication may be given to you upon discharge.  Take your pain medication as prescribed, if you still have uncontrolled pain after taking acetaminophen (Tylenol) or ibuprofen (Advil). °3. Use ice packs to help control pain. °4. If you need a refill on your pain medication, please contact your pharmacy.  They will contact our office to request authorization. Prescriptions will not be filled after 5pm or on week-ends. ° °HOME MEDICATIONS °5. Take your usually prescribed medications unless otherwise directed. ° °DIET °6. You should follow a light diet the first few days after arrival home.  Be sure to include lots of fluids daily. Avoid fatty, fried foods.  ° °CONSTIPATION °7. It is common to experience some constipation after surgery and if you are taking pain medication.  Increasing fluid intake and taking a stool  softener (such as Colace) will usually help or prevent this problem from occurring.  A mild laxative (Milk of Magnesia or Miralax) should be taken according to package instructions if there are no bowel movements after 48 hours. ° °WOUND/INCISION CARE °8. Most patients will experience some swelling and bruising in the area of the incisions.  Ice packs will help.  Swelling and bruising can take several days to resolve.  °9. Unless discharge instructions indicate otherwise, follow guidelines below  °a. STERI-STRIPS - you may remove your outer bandages 48 hours after surgery, and you may shower at that time.  You have steri-strips (small skin tapes) in place directly over the incision.  These strips should be left on the skin for 7-10 days.   °b. DERMABOND/SKIN GLUE - you may shower in 24 hours.  The glue will flake off over the next 2-3 weeks. °10. Any sutures or staples will be removed at the office during your follow-up visit. ° °ACTIVITIES °11. You may resume regular (light) daily activities beginning the next day--such as daily self-care, walking, climbing stairs--gradually increasing activities as tolerated.  You may have sexual intercourse when it is comfortable.  Refrain from any heavy lifting or straining until approved by your doctor. °a. You may drive when you are no longer taking prescription pain medication, you can comfortably wear a seatbelt, and you can safely maneuver your car and apply brakes. ° °FOLLOW-UP °12. You should see your doctor in the office for a follow-up appointment approximately 2-3 weeks after your surgery.  You should have been given your post-op/follow-up appointment when   your surgery was scheduled.  If you did not receive a post-op/follow-up appointment, make sure that you call for this appointment within a day or two after you arrive home to insure a convenient appointment time. ° ° °WHEN TO CALL YOUR DOCTOR: °1. Fever over 101.0 °2. Inability to urinate °3. Continued bleeding from  incision. °4. Increased pain, redness, or drainage from the incision. °5. Increasing abdominal pain ° °The clinic staff is available to answer your questions during regular business hours.  Please don’t hesitate to call and ask to speak to one of the nurses for clinical concerns.  If you have a medical emergency, go to the nearest emergency room or call 911.  A surgeon from [facility] is always on call at the hospital. °[address], [address], [place], [place]  [address] ? [address] [address], [place], [place]   [address] °[phone] ? [phone] ? FAX [phone] °••••••••• ° ° °Managing Your Pain After Surgery Without Opioids ° ° ° °Thank you for participating in our program to help patients manage their pain after surgery without opioids. This is part of our effort to provide you with the best care possible, without exposing you or your family to the risk that opioids pose. ° °What pain can I expect after surgery? °You can expect to have some pain after surgery. This is normal. The pain is typically worse the day after surgery, and quickly begins to get better. °Many studies have found that many patients are able to manage their pain after surgery with Over-the-Counter (OTC) medications such as Tylenol and Motrin. If you have a condition that does not allow you to take Tylenol or Motrin, notify your surgical team. ° °How will I manage my pain? °The best strategy for controlling your pain after surgery is around the clock pain control with Tylenol (acetaminophen) and Motrin (ibuprofen or Advil). Alternating these medications with each other allows you to maximize your pain control. In addition to Tylenol and Motrin, you can use heating pads or ice packs on your incisions to help reduce your pain. ° °How will I alternate your regular strength over-the-counter pain medication? °You will take a dose of pain medication every three hours. °; Start by taking 650 mg of Tylenol (2 pills of 325  mg) °; 3 hours later take 600 mg of Motrin (3 pills of 200 mg) °; 3 hours after taking the Motrin take 650 mg of Tylenol °; 3 hours after that take 600 mg of Motrin. ° ° °- 1 - ° °See example - if your first dose of Tylenol is at 12:00 PM ° ° °12:00 PM Tylenol 650 mg (2 pills of 325 mg)  °3:00 PM Motrin 600 mg (3 pills of 200 mg)  °6:00 PM Tylenol 650 mg (2 pills of 325 mg)  °9:00 PM Motrin 600 mg (3 pills of 200 mg)  °Continue alternating every 3 hours  ° °We recommend that you follow this schedule around-the-clock for at least 3 days after surgery, or until you feel that it is no longer needed. Use the table on the last page of this handout to keep track of the medications you are taking. °Important: °Do not take more than 3000mg of Tylenol or 3200mg of Motrin in a 24-hour period. °Do not take ibuprofen/Motrin if you have a history of bleeding stomach ulcers, severe kidney disease, &/or actively taking a blood thinner ° °What if I still have pain? °If you have pain that is not controlled   with the over-the-counter pain medications (Tylenol and Motrin or Advil) you might have what we call breakthrough pain. You will receive a prescription for a small amount of an opioid pain medication such as Oxycodone, Tramadol, or Tylenol with Codeine. Use these opioid pills in the first 24 hours after surgery if you have breakthrough pain. Do not take more than 1 pill every 4-6 hours.  If you still have uncontrolled pain after using all opioid pills, don't hesitate to call our staff using the number provided. We will help make sure you are managing your pain in the best way possible, and if necessary, we can provide a prescription for additional pain medication.   Day 1    Time  Name of Medication Number of pills taken  Amount of Acetaminophen  Pain Level   Comments  AM PM       AM PM       AM PM       AM PM       AM PM       AM PM       AM PM       AM PM       Total Daily amount of Acetaminophen Do not  take more than  3,000 mg per day      Day 2    Time  Name of Medication Number of pills taken  Amount of Acetaminophen  Pain Level   Comments  AM PM       AM PM       AM PM       AM PM       AM PM       AM PM       AM PM       AM PM       Total Daily amount of Acetaminophen Do not take more than  3,000 mg per day      Day 3    Time  Name of Medication Number of pills taken  Amount of Acetaminophen  Pain Level   Comments  AM PM       AM PM       AM PM       AM PM          AM PM       AM PM       AM PM       AM PM       Total Daily amount of Acetaminophen Do not take more than  3,000 mg per day      Day 4    Time  Name of Medication Number of pills taken  Amount of Acetaminophen  Pain Level   Comments  AM PM       AM PM       AM PM       AM PM       AM PM       AM PM       AM PM       AM PM       Total Daily amount of Acetaminophen Do not take more than  3,000 mg per day      Day 5    Time  Name of Medication Number of pills taken  Amount of Acetaminophen  Pain Level   Comments  AM PM       AM PM       AM PM  AM PM       AM PM       AM PM       AM PM       AM PM       Total Daily amount of Acetaminophen Do not take more than  3,000 mg per day       Day 6    Time  Name of Medication Number of pills taken  Amount of Acetaminophen  Pain Level  Comments  AM PM       AM PM       AM PM       AM PM       AM PM       AM PM       AM PM       AM PM       Total Daily amount of Acetaminophen Do not take more than  3,000 mg per day      Day 7    Time  Name of Medication Number of pills taken  Amount of Acetaminophen  Pain Level   Comments  AM PM       AM PM       AM PM       AM PM       AM PM       AM PM       AM PM       AM PM       Total Daily amount of Acetaminophen Do not take more than  3,000 mg per day        For additional information about how and where to safely dispose of unused  opioid medications - PrankCrew.uyhttps://www.morepowerfulnc.org  Disclaimer: This document contains information and/or instructional materials adapted from [facility] for the typical patient with your condition. It does not replace medical advice from your health care provider because your experience may differ from that of the typical patient. Talk to your health care provider if you have any questions about this document, your condition or your treatment plan. Adapted from [facility]   Pelvic Inflammatory Disease Pelvic inflammatory disease (PID) is an infection in some or all of the female organs. PID can be in the uterus, ovaries, fallopian tubes, or the surrounding tissues that are inside the lower belly area (pelvis). PID can lead to lasting problems if it is not treated. To check for this disease, your doctor may:  Do a physical exam.  Do blood tests, urine tests, or a pregnancy test.  Look at your vaginal discharge.  Do tests to look inside the pelvis.  Test you for other infections. Follow these instructions at home:  Take over-the-counter and prescription medicines only as told by your doctor.  If you were prescribed an antibiotic medicine, take it as told by your doctor. Do not stop taking it even if you start to feel better.  Do not have sex until treatment is done or as told by your doctor.  Tell your sex partner if you have PID. Your partner may need to be treated.  Keep all follow-up visits as told by your doctor. This is important.  Your doctor may test you for infection again 3 months after you are treated. Contact a doctor if:  You have more fluid (discharge) coming from your vagina or fluid that is not normal.  Your pain does not improve.  You throw up (vomit).  You have a fever.  You cannot  take your medicines.  Your partner has a sexually transmitted disease (STD).  You have pain when you pee (urinate). Get help right away if:  You have more  belly (abdominal) or lower belly pain.  You have chills.  You are not better after 72 hours. This information is not intended to replace advice given to you by your health care provider. Make sure you discuss any questions you have with your health care provider. Document Released: 12/01/2008 Document Revised: 02/10/2016 Document Reviewed: 10/12/2014 Elsevier Interactive Patient Education  2019 [employer].  Please read and follow all provided instructions.  You tested positive for gonorrhea. please refrain from sexual activity for 21 days for the medicine to take effect and notify your sexual partners for the last 6 months as they, too, may want to be tested. The website https://garcia.net/ can be used to send anonymous text messages or emails to alert sexual contacts.  It is very important to practice safe sex and use condoms when sexually active. If the test is positive, please refrain from sexual activity for 10 days for the medicine to take effect and notify your sexual partners for the last 6 months as they, too, may want to be tested. The website https://garcia.[email or web address] can be used to send anonymous text messages or emails to alert sexual contacts.  If you should develop severe or worsening pain in your abdomen or the pelvis or develop severe fevers, nausea or vomiting that prevent you from taking your medications, return to the emergency department immediately. Otherwise contact your local physician or county health department for a follow up appointment.I have attached the information for the health department.   Gonorrhea and Chlamydia SYMPTOMS  In females, symptoms may go unnoticed. Symptoms that are more noticeable can include:  Belly (abdominal) pain.  Painful intercourse.  Watery mucous-like discharge from the vagina.  Miscarriage.  Discomfort when urinating.  Inflammation of the rectum.  Abnormal gray-green frothy vaginal discharge  Vaginal itching and  irritation  Itching and irritation of the area outside the vagina.   Painful urination.  Bleeding after sexual intercourse.  It can cause longstanding (chronic) pelvic pain after frequent infections.  TREATMENT  It is important to finish ALL medications given to you.  If you have Gonorrhea or Chlamydia it can leave to PID, which can cause women to not be able to have children (sterile) if left untreated or if half-treated.   This is a sexually transmitted infection. So you are also at risk for other sexually transmitted diseases, including HIV (AIDS), it is recommended that you get tested as described above. HOME CARE INSTRUCTIONS  Warning: This infection is contagious. Do not have sex until treatment is completed. Follow up at your caregiver's office or the clinic to which you were referred. If your diagnosis (learning what is wrong) is confirmed by culture or some other method, your recent sexual contacts need treatment. Even if they are symptom free or have a negative culture or evaluation, they should be treated.  PREVENTION  Practice safe sex, use condoms, have only one sex partner and be sure your sex partner is not having sex with others.  Ask your caregiver to test you for Gonorrhea and Chlamydia at your regular checkups or sooner if you are having symptoms.  Ask for further information if you are pregnant.

## 2018-12-25 NOTE — Telephone Encounter (Signed)
Requested Prescriptions  Pending Prescriptions Disp Refills  . escitalopram (LEXAPRO) 10 MG tablet [Pharmacy Med Name: Escitalopram Oxalate 10 MG Oral Tablet] 45 tablet 0    Sig: TAKE 1 TABLET BY MOUTH ONCE DAILY .  AFTER  2  WEEKS  YOU  CAN  INCREASE  THE  DOSE  TO  20  MG  IF  NEEDED     Psychiatry:  Antidepressants - SSRI Passed - 12/25/2018 11:10 AM      Passed - Valid encounter within last 6 months    Recent Outpatient Visits          2 days ago Right lower quadrant abdominal pain   Primary [facility] at [facility], Eilleen Kempf, MD   5 months ago Generalized anxiety disorder   Primary Care at [facility], Madelaine Bhat, PA-C   7 months ago Encounter for surveillance of contraceptive pills   Primary Care at [facility], Madelaine Bhat, PA-C   9 months ago Annual physical exam   Primary Care at [facility], Madelaine Bhat, PA-C   1 year ago Sore throat   Primary Care at [facility], Madelaine Bhat, [place]

## 2018-12-25 NOTE — Plan of Care (Signed)
  Problem: Activity: Goal: Risk for activity intolerance will decrease Outcome: Progressing   Problem: Pain Managment: Goal: General experience of comfort will improve Outcome: Progressing   Problem: Safety: Goal: Ability to remain free from injury will improve Outcome: Progressing   

## 2018-12-26 NOTE — Anesthesia Postprocedure Evaluation (Signed)
Anesthesia Post Note  Patient: Sue Allen  Procedure(s) Performed: APPENDECTOMY LAPAROSCOPIC (N/A )     Patient location during evaluation: PACU Anesthesia Type: General Level of consciousness: awake and alert Pain management: pain level controlled Vital Signs Assessment: post-procedure vital signs reviewed and stable Respiratory status: spontaneous breathing, nonlabored ventilation, respiratory function stable and patient connected to nasal cannula oxygen Cardiovascular status: blood pressure returned to baseline and stable Postop Assessment: no apparent nausea or vomiting Anesthetic complications: no    Last Vitals:  Vitals:   12/25/18 0609 12/25/18 0943  BP: 119/74 135/90  Pulse: 90 (!) 107  Resp: 18 16  Temp: 37.1 C 37.1 C  SpO2: 99% 100%    Last Pain:  Vitals:   12/25/18 0943  TempSrc: Oral  PainSc: 0-No pain                 Elinor Kleine S

## 2019-01-08 ENCOUNTER — Other Ambulatory Visit (HOSPITAL_COMMUNITY)
Admission: RE | Admit: 2019-01-08 | Discharge: 2019-01-08 | Disposition: A | Discharge status: Home / Self Care | Payer: 59 | Source: Ambulatory Visit | Attending: Family Medicine | Admitting: Family Medicine

## 2019-01-08 ENCOUNTER — Encounter: Payer: Self-pay | Admitting: Family Medicine

## 2019-01-08 ENCOUNTER — Other Ambulatory Visit: Payer: Self-pay

## 2019-01-08 ENCOUNTER — Ambulatory Visit: Payer: No Typology Code available for payment source | Admitting: Family Medicine

## 2019-01-08 VITALS — BP 117/75 | HR 84 | Temp 98.0°F | Resp 16 | Ht 66.54 in | Wt 135.6 lb

## 2019-01-08 DIAGNOSIS — Z113 Encounter for screening for infections with a predominantly sexual mode of transmission: Secondary | ICD-10-CM | POA: Insufficient documentation

## 2019-01-08 DIAGNOSIS — N898 Other specified noninflammatory disorders of vagina: Secondary | ICD-10-CM | POA: Diagnosis not present

## 2019-01-08 LAB — POCT URINALYSIS DIP (MANUAL ENTRY)
Bilirubin, UA: NEGATIVE
Blood, UA: NEGATIVE
Glucose, UA: NEGATIVE mg/dL
Ketones, POC UA: NEGATIVE mg/dL
Leukocytes, UA: NEGATIVE
Nitrite, UA: NEGATIVE
Protein Ur, POC: NEGATIVE mg/dL
Spec Grav, UA: >=1.03 — AB
Urobilinogen, UA: 0.2 U/dL
pH, UA: 5.5

## 2019-01-08 LAB — POCT URINE PREGNANCY: Preg Test, Ur: NEGATIVE

## 2019-01-08 NOTE — Progress Notes (Signed)
Acute Office Visit  Subjective:    Patient ID: Sue Allen, female    DOB: [birth year], 23 y.o.   MRN: [id number]  Chief Complaint  Patient presents with  . STI Screensing    pt states there was a possible exspoure to Gonorrhea    HPI Patient is in today for h/o gonorrhea noted on vaginal swab 4/6/j20 at ER. 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 .No other STI when assessed per pt during hospitalization. Pt completed doxycycline and flagyl. LMP 3 weeks ago  Past Medical History:  Diagnosis Date  . Allergy   . Anemia     Past Surgical History:  Procedure Laterality Date  . LAPAROSCOPIC APPENDECTOMY N/A 12/24/2018   Procedure: APPENDECTOMY LAPAROSCOPIC;  Surgeon: Berna Bue, MD;  Location: [facility];  Service: General;  Laterality: N/A;    Family History  Problem Relation Age of Onset  . Diabetes Mother   . Hyperlipidemia Mother   . Hypertension Mother   . Hyperlipidemia Father   . Diabetes Maternal Grandmother   . Alzheimer's disease Maternal Grandmother   . Glaucoma Maternal Grandmother     Social History   Socioeconomic History  . Marital status: Single    Spouse name: Not on file  . Number of children: Not on file  . Years of education: Not on file  . Highest education level: Not on file  Occupational History  . Not on file  Social Needs  . Financial resource strain: Not on file  . Food insecurity:    Worry: Not on file    Inability: Not on file  . Transportation needs:    Medical: Not on file    Non-medical: Not on file  Tobacco Use  . Smoking status: Never Smoker  . Smokeless tobacco: Never Used  Substance and Sexual Activity  . Alcohol use: Not Currently    Alcohol/week: 1.0 standard drinks    Types: 1 Standard drinks or equivalent per week  . Drug use: Yes    Types: Marijuana  . Sexual activity: Never  Lifestyle  . Physical activity:    Days per week: Not on file   Minutes per session: Not on file  . Stress: Not on file  Relationships  . Social connections:    Talks on phone: Not on file    Gets together: Not on file    Attends religious service: Not on file    Active member of club or organization: Not on file    Attends meetings of clubs or organizations: Not on file    Relationship status: Not on file  . Intimate partner violence:    Fear of current or ex partner: Not on file    Emotionally abused: Not on file    Physically abused: Not on file    Forced sexual activity: Not on file  Other Topics Concern  . Not on file  Social History Narrative  . Not on file    Outpatient Medications Prior to Visit  Medication Sig Dispense Refill  . Aspirin-Acetaminophen-Caffeine (PAMPRIN MAX PO) Take 1 tablet by mouth at bedtime as needed (pain).    Marland Kitchen docusate sodium (COLACE) 100 MG capsule Take 1 capsule (100 mg total) by mouth daily as needed. 30 capsule 2  . escitalopram (LEXAPRO) 10 MG tablet TAKE 1 TABLET BY MOUTH ONCE DAILY .  AFTER  2  WEEKS  YOU  CAN  INCREASE  THE  DOSE  TO  20  MG  IF  NEEDED 45 tablet 0  . ferrous sulfate 325 (65 FE) MG tablet Take 1 tablet (325 mg total) by mouth every other day. Monday, Wednesday and Friday. Take with 6 oz orange juice 30 tablet 3  . fexofenadine (ALLEGRA) 180 MG tablet Take 180 mg by mouth daily.    Marland Kitchen levonorgestrel-ethinyl estradiol (AVIANE) 0.1-20 MG-MCG tablet Take 1 tablet by mouth daily. 1 Package 11  . naproxen sodium (ALEVE) 220 MG tablet Take 220 mg by mouth daily as needed (pain).    . ondansetron (ZOFRAN-ODT) 4 MG disintegrating tablet Take 1 tablet (4 mg total) by mouth every 6 (six) hours as needed for nausea. 20 tablet 0  . amphetamine-dextroamphetamine (ADDERALL XR) 20 MG 24 hr capsule Take 1 capsule (20 mg total) by mouth daily. 30 capsule 0  . doxycycline (VIBRAMYCIN) 100 MG capsule Take 1 capsule (100 mg total) by mouth 2 (two) times daily. 28 capsule 0  . metroNIDAZOLE (FLAGYL) 500 MG tablet  Take 1 tablet (500 mg total) by mouth 2 (two) times daily with a meal. DO NOT CONSUME ALCOHOL WHILE TAKING THIS MEDICATION. 28 tablet 0  . oxyCODONE (OXY IR/ROXICODONE) 5 MG immediate release tablet Take 1 tablet (5 mg total) by mouth every 6 (six) hours as needed. 12 tablet 0   No facility-administered medications prior to visit.     No Known Allergies  ROS    CONSTITUTIONAL: no , fever GII: no constipation, diarrhea, abdominal pain, nausea, vomiting, GU: no pain with urination, drinking little water  Objective:    Physical Exam  Abdominal: Soft. There is no abdominal tenderness.  Genitourinary:    Uterus normal.     Vaginal discharge present.   vaginal-no lesions, small amount of drainage, no odor swab used to collect drainage for GC/CL/Tric-endocervical Surgical site well healed, no eryth or drainage  BP 117/75   Pulse 84   Temp 98 F (36.7 C) (Oral)   Resp 16   Ht 5' 6.54" (1.69 m)   Wt 135 lb 9.6 oz (61.5 kg)   LMP 12/16/2018   SpO2 98%   BMI 21.54 kg/m  Wt Readings from Last 3 Encounters:  01/08/19 135 lb 9.6 oz (61.5 kg)  12/23/18 144 lb (65.3 kg)  12/23/18 144 lb 3.2 oz (65.4 kg)     Lab Results  Component Value Date   WBC 13.3 (H) 12/25/2018   HGB 11.3 (L) 12/25/2018   HCT 36.9 12/25/2018   MCV 82.0 12/25/2018   PLT 240 12/25/2018   Lab Results  Component Value Date   NA 136 12/24/2018   K 3.0 (L) 12/24/2018   CO2 22 12/24/2018   GLUCOSE 109 (H) 12/24/2018   BUN 7 12/24/2018   CREATININE 0.73 12/24/2018   BILITOT 0.8 12/23/2018   ALKPHOS 63 12/23/2018   AST 14 (L) 12/23/2018   ALT 11 12/23/2018   PROT 8.6 (H) 12/23/2018   ALBUMIN 4.2 12/23/2018   CALCIUM 8.7 (L) 12/24/2018   ANIONGAP 10 12/24/2018   Lab Results  Component Value Date   CHOL 138 03/27/2018   Lab Results  Component Value Date   HDL 55 03/27/2018   Lab Results  Component Value Date   LDLCALC 67 03/27/2018   Lab Results  Component Value Date   TRIG 79 03/27/2018    Lab Results  Component Value Date   CHOLHDL 2.5 03/27/2018      Assessment & Plan:    1. Routine  screening for STI (sexually transmitted infection) rescreen for GON-+when hospitalized, no Chlamydia, clue cells noted on wet prep-flagyl and doxy completed - POCT urine pregnancy-NEG - Cervicovaginal ancillary only - POCT urinalysis dipstick-white cells noted  - Urine Culture-no additional antibiitics currently 2. Vaginal discharge GC/CL/Tri-pending  Georga KaufmannLISA LEIGH Sajid Ruppert, MD  01-08-19

## 2019-01-08 NOTE — Patient Instructions (Addendum)
  Will notify when cultures and urine culture completed   If you have lab work done today you will be contacted with your lab results within the next 2 weeks.  If you have not heard from [place] then please contact us. The fastest way to get your results is to register for My Chart.   IF you received an x-ray today, you will receive an invoice from [facility]. Please contact [facility] at [phone] with questions or concerns regarding your invoice.   IF you received labwork today, you will receive an invoice from [facility]. Please contact [facility] at [phone] with questions or concerns regarding your invoice.   Our billing staff will not be able to assist you with questions regarding bills from these companies.  You will be contacted with the lab results as soon as they are available. The fastest way to get your results is to activate your My Chart account. Instructions are located on the last page of this paperwork. If you have not heard from [place] regarding the results in 2 weeks, please contact this office.

## 2019-01-09 LAB — CERVICOVAGINAL ANCILLARY ONLY
Chlamydia: NEGATIVE
Neisseria Gonorrhea: NEGATIVE
Trichomonas: NEGATIVE

## 2019-01-09 LAB — URINE CULTURE

## 2019-01-13 ENCOUNTER — Telehealth: Payer: Self-pay | Admitting: Family Medicine

## 2019-01-13 NOTE — Telephone Encounter (Signed)
Copied from CRM [id number]. Topic: Quick Communication - See Telephone Encounter >> Jan 13, 2019  1:37 PM Angela Nevin wrote: CRM for notification. See Telephone encounter for: 01/13/19.   Patient calling to check status of lab results from 4/22. Please advise.

## 2019-01-16 NOTE — Telephone Encounter (Signed)
Called pt and relayed the results. It was neg. She already knew

## 2019-01-27 ENCOUNTER — Telehealth: Payer: Self-pay | Admitting: Family Medicine

## 2019-01-27 NOTE — Telephone Encounter (Signed)
Copied from CRM [id number]. Topic: General - Inquiry >> Jan 24, 2019  1:12 PM Sue Allen wrote: Reason for CRM: pt wants to know how long she should wait until she gets a tattoo to cover up her scar.

## 2019-01-29 NOTE — Telephone Encounter (Signed)
Please advise about her getting a tattoo how long should she wait until her scar is healed?

## 2019-02-13 NOTE — Telephone Encounter (Signed)
Not able to give recommendation tattoo

## 2019-02-22 ENCOUNTER — Other Ambulatory Visit: Payer: Self-pay | Admitting: Family Medicine

## 2019-02-22 DIAGNOSIS — F411 Generalized anxiety disorder: Secondary | ICD-10-CM

## 2019-02-22 NOTE — Telephone Encounter (Signed)
Requested Prescriptions  Pending Prescriptions Disp Refills  . escitalopram (LEXAPRO) 10 MG tablet [Pharmacy Med Name: Escitalopram Oxalate 10 MG Oral Tablet] 45 tablet 0    Sig: TAKE 1 TABLET BY MOUTH ONCE DAILY ,  MAY  INCREASE  TO  2  TABLETS  DAILY  AFTER  2  WEEKS  IF  NEEDED     Psychiatry:  Antidepressants - SSRI Failed - 02/22/2019  2:17 PM      Failed - Completed PHQ-2 or PHQ-9 in the last 360 days.      Passed - Valid encounter within last 6 months    Recent Outpatient Visits          1 month ago Routine screening for STI (sexually transmitted infection)   Primary Care at [place], [place]   2 months ago Right lower quadrant abdominal pain   Primary Care at [facility], Ines Bloomer, MD   7 months ago Generalized anxiety disorder   Primary Care at [facility], Gelene Mink, PA-C   9 months ago Encounter for surveillance of contraceptive pills   Primary Care at [facility], Gelene Mink, [place]   11 months ago Annual physical exam   Primary Care at [facility], [place], [place]

## 2019-03-10 ENCOUNTER — Ambulatory Visit: Payer: No Typology Code available for payment source | Admitting: Family Medicine

## 2019-03-10 ENCOUNTER — Other Ambulatory Visit: Payer: Self-pay

## 2019-03-10 ENCOUNTER — Encounter: Payer: Self-pay | Admitting: Family Medicine

## 2019-03-10 ENCOUNTER — Other Ambulatory Visit (HOSPITAL_COMMUNITY)
Admission: RE | Admit: 2019-03-10 | Discharge: 2019-03-10 | Disposition: A | Discharge status: Home / Self Care | Payer: BLUE CROSS/BLUE SHIELD | Source: Ambulatory Visit | Attending: Family Medicine | Admitting: Family Medicine

## 2019-03-10 VITALS — BP 115/77 | HR 75 | Temp 98.3°F | Resp 18 | Ht 66.54 in | Wt 137.0 lb

## 2019-03-10 DIAGNOSIS — N898 Other specified noninflammatory disorders of vagina: Secondary | ICD-10-CM | POA: Diagnosis present

## 2019-03-10 DIAGNOSIS — B373 Candidiasis of vulva and vagina: Secondary | ICD-10-CM

## 2019-03-10 DIAGNOSIS — N760 Acute vaginitis: Secondary | ICD-10-CM | POA: Diagnosis not present

## 2019-03-10 DIAGNOSIS — B9689 Other specified bacterial agents as the cause of diseases classified elsewhere: Secondary | ICD-10-CM | POA: Diagnosis not present

## 2019-03-10 DIAGNOSIS — B3731 Acute candidiasis of vulva and vagina: Secondary | ICD-10-CM

## 2019-03-10 LAB — POCT WET + KOH PREP
Trich by wet prep: ABSENT
Yeast by wet prep: ABSENT

## 2019-03-10 LAB — POCT URINE PREGNANCY: Preg Test, Ur: NEGATIVE

## 2019-03-10 MED ORDER — FLUCONAZOLE 150 MG PO TABS: 150.0000 mg | ORAL_TABLET | Freq: Once | ORAL | 0 refills | Status: AC

## 2019-03-10 MED ORDER — METRONIDAZOLE 500 MG PO TABS: 500.0000 mg | ORAL_TABLET | Freq: Two times a day (BID) | ORAL | 0 refills | Status: DC

## 2019-03-10 MED ORDER — CLOTRIMAZOLE-BETAMETHASONE 1-0.05 % EX CREA: 1.0000 "application " | TOPICAL_CREAM | Freq: Two times a day (BID) | CUTANEOUS | 0 refills | Status: DC

## 2019-03-10 NOTE — Progress Notes (Signed)
Established Patient Office Visit  Subjective:  Patient ID: Sue Allen, female    DOB: Feb 05, 1996  Age: 23 y.o. MRN: [id number]  CC:  Chief Complaint  Patient presents with  . rash in vaginal area since last week    ? yeast infection per pt noticed funny discharge x 2 days ago, no otc med for yeast    HPI Sue Monksyana Mandarino presents for   Vaginal itching for 2 days Uses antibacterial soap No sexual intercourse since March after she was treated for Gonorrhea No fevers or chills  No dysuria Patient's last menstrual period was 02/24/2019. She denies being pregnant   Past Medical History:  Diagnosis Date  . Allergy   . Anemia     Past Surgical History:  Procedure Laterality Date  . LAPAROSCOPIC APPENDECTOMY N/A 12/24/2018   Procedure: APPENDECTOMY LAPAROSCOPIC;  Surgeon: Berna Bueonnor, Chelsea A, MD;  Location: [facility];  Service: General;  Laterality: N/A;    Family History  Problem Relation Age of Onset  . Diabetes Mother   . Hyperlipidemia Mother   . Hypertension Mother   . Hyperlipidemia Father   . Diabetes Maternal Grandmother   . Alzheimer's disease Maternal Grandmother   . Glaucoma Maternal Grandmother     Social History   Socioeconomic History  . Marital status: Single    Spouse name: Not on file  . Number of children: Not on file  . Years of education: Not on file  . Highest education level: Not on file  Occupational History  . Not on file  Social Needs  . Financial resource strain: Not on file  . Food insecurity    Worry: Not on file    Inability: Not on file  . Transportation needs    Medical: Not on file    Non-medical: Not on file  Tobacco Use  . Smoking status: Never Smoker  . Smokeless tobacco: Never Used  Substance and Sexual Activity  . Alcohol use: Not Currently    Alcohol/week: 1.0 standard drinks    Types: 1 Standard drinks or equivalent per week  . Drug use: Yes    Types: Marijuana  . Sexual activity: Never  Lifestyle  . Physical  activity    Days per week: Not on file    Minutes per session: Not on file  . Stress: Not on file  Relationships  . Social Musicianconnections    Talks on phone: Not on file    Gets together: Not on file    Attends religious service: Not on file    Active member of club or organization: Not on file    Attends meetings of clubs or organizations: Not on file    Relationship status: Not on file  . Intimate partner violence    Fear of current or ex partner: Not on file    Emotionally abused: Not on file    Physically abused: Not on file    Forced sexual activity: Not on file  Other Topics Concern  . Not on file  Social History Narrative  . Not on file    Outpatient Medications Prior to Visit  Medication Sig Dispense Refill  . Aspirin-Acetaminophen-Caffeine (PAMPRIN MAX PO) Take 1 tablet by mouth at bedtime as needed (pain).    Marland Kitchen. docusate sodium (COLACE) 100 MG capsule Take 1 capsule (100 mg total) by mouth daily as needed. 30 capsule 2  . escitalopram (LEXAPRO) 10 MG tablet TAKE 1 TABLET BY MOUTH ONCE DAILY ,  MAY  INCREASE  TO  2  TABLETS  DAILY  AFTER  2  WEEKS  IF  NEEDED 45 tablet 0  . ferrous sulfate 325 (65 FE) MG tablet Take 1 tablet (325 mg total) by mouth every other day. Monday, Wednesday and Friday. Take with 6 oz orange juice 30 tablet 3  . levonorgestrel-ethinyl estradiol (AVIANE) 0.1-20 MG-MCG tablet Take 1 tablet by mouth daily. 1 Package 11  . naproxen sodium (ALEVE) 220 MG tablet Take 220 mg by mouth daily as needed (pain).    . fexofenadine (ALLEGRA) 180 MG tablet Take 180 mg by mouth daily.    . ondansetron (ZOFRAN-ODT) 4 MG disintegrating tablet Take 1 tablet (4 mg total) by mouth every 6 (six) hours as needed for nausea. (Patient not taking: Reported on 03/10/2019) 20 tablet 0   No facility-administered medications prior to visit.     No Known Allergies  ROS Review of Systems    Objective:    Physical Exam  BP 115/77 (BP Location: Left Arm, Patient Position:  Sitting, Cuff Size: Normal)   Pulse 75   Temp 98.3 F (36.8 C) (Oral)   Resp 18   Ht 5' 6.54" (1.69 m)   Wt 137 lb (62.1 kg)   LMP 02/24/2019   SpO2 100%   BMI 21.75 kg/m  Wt Readings from Last 3 Encounters:  03/10/19 137 lb (62.1 kg)  01/08/19 135 lb 9.6 oz (61.5 kg)  12/23/18 144 lb (65.3 kg)  Physical Exam  Constitutional: Oriented to person, place, and time. Appears well-developed and well-nourished.  HENT:  Head: Normocephalic and atraumatic.  Eyes: Conjunctivae and EOM are normal.  Pulmonary/Chest: Effort normal Neurological: Is alert and oriented to person, place, and time.  Skin: Skin is warm. Capillary refill takes less than 2 seconds.  Psychiatric: Has a normal mood and affect. Behavior is normal. Judgment and thought content normal.   Vaginal exam- Chaperone Present Labia normal bilaterally without skin lesions Urethral meatus normal appearing without erythema Vagina with discharge Self collect wet prep with clue cells and yeast   There are no preventive care reminders to display for this patient.  There are no preventive care reminders to display for this patient.  No results found for: TSH Lab Results  Component Value Date   WBC 13.3 (H) 12/25/2018   HGB 11.3 (L) 12/25/2018   HCT 36.9 12/25/2018   MCV 82.0 12/25/2018   PLT 240 12/25/2018   Lab Results  Component Value Date   NA 136 12/24/2018   K 3.0 (L) 12/24/2018   CO2 22 12/24/2018   GLUCOSE 109 (H) 12/24/2018   BUN 7 12/24/2018   CREATININE 0.73 12/24/2018   BILITOT 0.8 12/23/2018   ALKPHOS 63 12/23/2018   AST 14 (L) 12/23/2018   ALT 11 12/23/2018   PROT 8.6 (H) 12/23/2018   ALBUMIN 4.2 12/23/2018   CALCIUM 8.7 (L) 12/24/2018   ANIONGAP 10 12/24/2018   Lab Results  Component Value Date   CHOL 138 03/27/2018   Lab Results  Component Value Date   HDL 55 03/27/2018   Lab Results  Component Value Date   LDLCALC 67 03/27/2018   Lab Results  Component Value Date   TRIG 79  03/27/2018   Lab Results  Component Value Date   CHOLHDL 2.5 03/27/2018   No results found for: HGBA1C    Assessment & Plan:   Problem List Items Addressed This Visit      Other   Vaginal discharge - Primary   Relevant Medications  metroNIDAZOLE (FLAGYL) 500 MG tablet   fluconazole (DIFLUCAN) 150 MG tablet   clotrimazole-betamethasone (LOTRISONE) cream   Other Relevant Orders   POCT Wet + KOH Prep (Completed)   GC/Chlamydia probe amp ([place])not at [facility]    Other Visit Diagnoses    BV (bacterial vaginosis)       Relevant Medications   metroNIDAZOLE (FLAGYL) 500 MG tablet   fluconazole (DIFLUCAN) 150 MG tablet   clotrimazole-betamethasone (LOTRISONE) cream   Yeast vaginitis       Relevant Medications   metroNIDAZOLE (FLAGYL) 500 MG tablet   fluconazole (DIFLUCAN) 150 MG tablet   clotrimazole-betamethasone (LOTRISONE) cream     Will treat for BV with flagyl. Gave precautions about disulfram reaction Will treat yeast with diflucan Discussed home skin care Will check for GC/CT   Meds ordered this encounter  Medications  . metroNIDAZOLE (FLAGYL) 500 MG tablet    Sig: Take 1 tablet (500 mg total) by mouth 2 (two) times daily.    Dispense:  14 tablet    Refill:  0  . fluconazole (DIFLUCAN) 150 MG tablet    Sig: Take 1 tablet (150 mg total) by mouth once for 1 dose. Repeat second dose in 3 days.    Dispense:  2 tablet    Refill:  0  . clotrimazole-betamethasone (LOTRISONE) cream    Sig: Apply 1 application topically 2 (two) times daily.    Dispense:  30 g    Refill:  0    Follow-up: Return if symptoms worsen or fail to improve.    Forrest Moron, MD

## 2019-03-10 NOTE — Patient Instructions (Addendum)
Avoid fragrance and antibacterial soap in the groin Use mild soap Wear loose fitting clothing  Apply lotrisone twice a day to help with external itching Take metronidazole one tablet twice a day for 7 days.  NO ALCOHOL WHILE ON THIS MEDICATION.  After completing metronidazole take Diflucan. One tablet then 3 days later take second tablet    If you have lab work done today you will be contacted with your lab results within the next 2 weeks.  If you have not heard from us then please contact us. The fastest way to get your results is to register for My Chart.   IF you received an x-ray today, you will receive an invoice from [facility]. Please contact [facility] at [phone] with questions or concerns regarding your invoice.   IF you received labwork today, you will receive an invoice from [facility]. Please contact [facility] at [phone] with questions or concerns regarding your invoice.   Our billing staff will not be able to assist you with questions regarding bills from these companies.  You will be contacted with the lab results as soon as they are available. The fastest way to get your results is to activate your My Chart account. Instructions are located on the last page of this paperwork. If you have not heard from us regarding the results in 2 weeks, please contact this office.     Vaginitis Vaginitis is a condition in which the vaginal tissue swells and becomes red (inflamed). This condition is most often caused by a change in the normal balance of bacteria and yeast that live in the vagina. This change causes an overgrowth of certain bacteria or yeast, which causes the inflammation. There are different types of vaginitis, but the most common types are:  Bacterial vaginosis.  Yeast infection (candidiasis).  Trichomoniasis vaginitis. This is a sexually transmitted disease (STD).  Viral vaginitis.  Atrophic vaginitis.  Allergic vaginitis. What are  the causes? The cause of this condition depends on the type of vaginitis. It can be caused by:  Bacteria (bacterial vaginosis).  Yeast, which is a fungus (yeast infection).  A parasite (trichomoniasis vaginitis).  A virus (viral vaginitis).  Low hormone levels (atrophic vaginitis). Low hormone levels can occur during pregnancy, breastfeeding, or after menopause.  Irritants, such as bubble baths, scented tampons, and feminine sprays (allergic vaginitis). Other factors can change the normal balance of the yeast and bacteria that live in the vagina. These include:  Antibiotic medicines.  Poor hygiene.  Diaphragms, vaginal sponges, spermicides, birth control pills, and intrauterine devices (IUD).  Sex.  Infection.  Uncontrolled diabetes.  A weakened defense (immune) system. What increases the risk? This condition is more likely to develop in women who:  Smoke.  Use vaginal douches, scented tampons, or scented sanitary pads.  Wear tight-fitting pants.  Wear thong underwear.  Use oral birth control pills or an IUD.  Have sex without a condom.  Have multiple sex partners.  Have an STD.  Frequently use the spermicide nonoxynol-9.  Eat lots of foods high in sugar.  Have uncontrolled diabetes.  Have low estrogen levels.  Have a weakened immune system from an immune disorder or medical treatment.  Are pregnant or breastfeeding. What are the signs or symptoms? Symptoms vary depending on the cause of the vaginitis. Common symptoms include:  Abnormal vaginal discharge. ? The discharge is white, gray, or yellow with bacterial vaginosis. ? The discharge is thick, white, and cheesy with a yeast infection. ? The discharge is  frothy and yellow or greenish with trichomoniasis.  A bad vaginal smell. The smell is fishy with bacterial vaginosis.  Vaginal itching, pain, or swelling.  Sex that is painful.  Pain or burning when urinating. Sometimes there are no  symptoms. How is this diagnosed? This condition is diagnosed based on your symptoms and medical history. A physical exam, including a pelvic exam, will also be done. You may also have other tests, including:  Tests to determine the pH level (acidity or alkalinity) of your vagina.  A whiff test, to assess the odor that results when a sample of your vaginal discharge is mixed with a potassium hydroxide solution.  Tests of vaginal fluid. A sample will be examined under a microscope. How is this treated? Treatment varies depending on the type of vaginitis you have. Your treatment may include:  Antibiotic creams or pills to treat bacterial vaginosis and trichomoniasis.  Antifungal medicines, such as vaginal creams or suppositories, to treat a yeast infection.  Medicine to ease discomfort if you have viral vaginitis. Your sexual partner should also be treated.  Estrogen delivered in a cream, pill, suppository, or vaginal ring to treat atrophic vaginitis. If vaginal dryness occurs, lubricants and moisturizing creams may help. You may need to avoid scented soaps, sprays, or douches.  Stopping use of a product that is causing allergic vaginitis. Then using a vaginal cream to treat the symptoms. Follow these instructions at home: Lifestyle  Keep your genital area clean and dry. Avoid soap, and only rinse the area with water.  Do not douche or use tampons until your health care provider says it is okay to do so. Use sanitary pads, if needed.  Do not have sex until your health care provider approves. When you can return to sex, practice safe sex and use condoms.  Wipe from front to back. This avoids the spread of bacteria from the rectum to the vagina. General instructions  Take over-the-counter and prescription medicines only as told by your health care provider.  If you were prescribed an antibiotic medicine, take or use it as told by your health care provider. Do not stop taking or using  the antibiotic even if you start to feel better.  Keep all follow-up visits as told by your health care provider. This is important. How is this prevented?  Use mild, non-scented products. Do not use things that can irritate the vagina, such as fabric softeners. Avoid the following products if they are scented: ? Feminine sprays. ? Detergents. ? Tampons. ? Feminine hygiene products. ? Soaps or bubble baths.  Let air reach your genital area. ? Wear cotton underwear to reduce moisture buildup. ? Avoid wearing underwear while you sleep. ? Avoid wearing tight pants and underwear or nylons without a cotton panel. ? Avoid wearing thong underwear.  Take off any wet clothing, such as bathing suits, as soon as possible.  Practice safe sex and use condoms. Contact a health care provider if:  You have abdominal pain.  You have a fever.  You have symptoms that last for more than 2-3 days. Get help right away if:  You have a fever and your symptoms suddenly get worse. Summary  Vaginitis is a condition in which the vaginal tissue becomes inflamed.This condition is most often caused by a change in the normal balance of bacteria and yeast that live in the vagina.  Treatment varies depending on the type of vaginitis you have.  Do not douche, use tampons , or have sex until  your health care provider approves. When you can return to sex, practice safe sex and use condoms. This information is not intended to replace advice given to you by your health care provider. Make sure you discuss any questions you have with your health care provider. Document Released: 07/02/2007 Document Revised: 10/10/2016 Document Reviewed: 10/10/2016 Elsevier Interactive Patient Education  2019 [employer].

## 2019-03-11 LAB — GC/CHLAMYDIA PROBE AMP (~~LOC~~) NOT AT ARMC
Chlamydia: NEGATIVE
Neisseria Gonorrhea: NEGATIVE

## 2019-03-31 ENCOUNTER — Ambulatory Visit: Payer: No Typology Code available for payment source | Admitting: Family Medicine

## 2019-04-01 ENCOUNTER — Encounter: Payer: Self-pay | Admitting: Family Medicine

## 2019-05-26 ENCOUNTER — Other Ambulatory Visit: Payer: Self-pay | Admitting: Physician Assistant

## 2019-05-26 DIAGNOSIS — Z3041 Encounter for surveillance of contraceptive pills: Secondary | ICD-10-CM

## 2019-05-27 NOTE — Telephone Encounter (Signed)
Requested medication (s) are due for refill today: yes  Requested medication (s) are on the active medication list: yes  Last refill:02/04/2019  Future visit scheduled: yes  Notes to clinic:  Ordering provider and pcp are different    Requested Prescriptions  Pending Prescriptions Disp Refills   AVIANE 0.1-20 MG-MCG tablet [Pharmacy Med Name: Aviane 0.1-20 MG-MCG Oral Tablet] 28 tablet 0    Sig: Take 1 tablet by mouth once daily     OB/GYN:  Contraceptives Passed - 05/26/2019  3:34 PM      Passed - Last BP in normal range    BP Readings from Last 1 Encounters:  03/10/19 115/77         Passed - Valid encounter within last 12 months    Recent Outpatient Visits          2 months ago Vaginal discharge   Primary Care at [facility], [place], [place]   4 months ago Routine screening for STI (sexually transmitted infection)   Primary Care at [facility], Rex Kras, MD   5 months ago Right lower quadrant abdominal pain   Primary Care at [facility], Ines Bloomer, [place]   10 months ago Generalized anxiety disorder   Primary Care at [facility], Gelene Mink, PA-C   1 year ago Encounter for surveillance of contraceptive pills   Primary Care at [facility], Gelene Mink, PA-C      Future Appointments            In 6 days Forrest Moron, MD Primary Care at [place], [facility]

## 2019-06-02 ENCOUNTER — Ambulatory Visit: Payer: No Typology Code available for payment source | Admitting: Family Medicine

## 2019-06-02 ENCOUNTER — Ambulatory Visit (INDEPENDENT_AMBULATORY_CARE_PROVIDER_SITE_OTHER): Payer: No Typology Code available for payment source

## 2019-06-02 ENCOUNTER — Other Ambulatory Visit: Payer: Self-pay

## 2019-06-02 ENCOUNTER — Other Ambulatory Visit (HOSPITAL_COMMUNITY)
Admission: RE | Admit: 2019-06-02 | Discharge: 2019-06-02 | Disposition: A | Discharge status: Home / Self Care | Payer: 59 | Source: Ambulatory Visit | Attending: Family Medicine | Admitting: Family Medicine

## 2019-06-02 ENCOUNTER — Encounter: Payer: Self-pay | Admitting: Family Medicine

## 2019-06-02 VITALS — BP 109/79 | HR 77 | Temp 98.5°F | Resp 17 | Ht 66.54 in | Wt 146.4 lb

## 2019-06-02 DIAGNOSIS — M545 Low back pain, unspecified: Secondary | ICD-10-CM

## 2019-06-02 DIAGNOSIS — Z23 Encounter for immunization: Secondary | ICD-10-CM | POA: Diagnosis not present

## 2019-06-02 DIAGNOSIS — Z0000 Encounter for general adult medical examination without abnormal findings: Secondary | ICD-10-CM

## 2019-06-02 DIAGNOSIS — Z113 Encounter for screening for infections with a predominantly sexual mode of transmission: Secondary | ICD-10-CM

## 2019-06-02 DIAGNOSIS — Z0001 Encounter for general adult medical examination with abnormal findings: Secondary | ICD-10-CM | POA: Diagnosis not present

## 2019-06-02 DIAGNOSIS — Z289 Immunization not carried out for unspecified reason: Secondary | ICD-10-CM | POA: Diagnosis not present

## 2019-06-02 MED ORDER — CLINDAMYCIN PHOSPHATE 1 % EX LOTN: TOPICAL_LOTION | Freq: Two times a day (BID) | CUTANEOUS | 0 refills | Status: DC

## 2019-06-02 MED ORDER — HYDROCORTISONE 2.5 % EX CREA: TOPICAL_CREAM | Freq: Two times a day (BID) | CUTANEOUS | 0 refills | Status: DC

## 2019-06-02 NOTE — Progress Notes (Signed)
Chief Complaint  Patient presents with  . pap and flu shot     pt has knot on back she is concerned about, no pap pt on menses, but has a rash down there from using a scented oil and would like this checked.  Pt would like std screenings    Subjective:  Sue Allen is a 23 y.o. female here for a health maintenance visit.  Patient is established pt  Patient reports that she has a painful knot on her back for 2-3 months She states that it is getting enlarged She denies any fevers or chills No injury  She also reports that she shaved her mons pubis and put essential oils on the skin and developed bumps like ingrown hairs. No vesicles. No blisters. She states that it is improving so far.  She has to return for her pap smear because she is on the heavy bleeding days of her cycle. Patient's last menstrual period was 05/30/2019.   Patient Active Problem List   Diagnosis Date Noted  . Vaginal discharge 01/08/2019  . Routine screening for STI (sexually transmitted infection) 01/08/2019  . Gonorrhea 12/25/2018  . Right lower quadrant abdominal pain 12/23/2018  . Nausea without vomiting 12/23/2018  . Appendicitis with peritonitis s/p lap appendectomy 12/24/2018 12/23/2018    Past Medical History:  Diagnosis Date  . Allergy   . Anemia     Past Surgical History:  Procedure Laterality Date  . LAPAROSCOPIC APPENDECTOMY N/A 12/24/2018   Procedure: APPENDECTOMY LAPAROSCOPIC;  Surgeon: Berna Bueonnor, Chelsea A, MD;  Location: [facility];  Service: General;  Laterality: N/A;     Outpatient Medications Prior to Visit  Medication Sig Dispense Refill  . amphetamine-dextroamphetamine (ADDERALL) 10 MG tablet Take 10 mg by mouth daily with breakfast.    . Aspirin-Acetaminophen-Caffeine (PAMPRIN MAX PO) Take 1 tablet by mouth at bedtime as needed (pain).    Marland Kitchen. AVIANE 0.1-20 MG-MCG tablet Take 1 tablet by mouth once daily 28 tablet 0  . cholecalciferol (VITAMIN D3) 25 MCG (1000 UT) tablet Take 1,000 Units by  mouth every other day.    . escitalopram (LEXAPRO) 10 MG tablet TAKE 1 TABLET BY MOUTH ONCE DAILY ,  MAY  INCREASE  TO  2  TABLETS  DAILY  AFTER  2  WEEKS  IF  NEEDED 45 tablet 0  . ferrous sulfate 325 (65 FE) MG tablet Take 1 tablet (325 mg total) by mouth every other day. Monday, Wednesday and Friday. Take with 6 oz orange juice 30 tablet 3  . metroNIDAZOLE (FLAGYL) 500 MG tablet Take 1 tablet (500 mg total) by mouth 2 (two) times daily. 14 tablet 0  . clotrimazole-betamethasone (LOTRISONE) cream Apply 1 application topically 2 (two) times daily. (Patient not taking: Reported on 06/02/2019) 30 g 0  . docusate sodium (COLACE) 100 MG capsule Take 1 capsule (100 mg total) by mouth daily as needed. (Patient not taking: Reported on 06/02/2019) 30 capsule 2  . fexofenadine (ALLEGRA) 180 MG tablet Take 180 mg by mouth daily.    . naproxen sodium (ALEVE) 220 MG tablet Take 220 mg by mouth daily as needed (pain).    . ondansetron (ZOFRAN-ODT) 4 MG disintegrating tablet Take 1 tablet (4 mg total) by mouth every 6 (six) hours as needed for nausea. (Patient not taking: Reported on 03/10/2019) 20 tablet 0   No facility-administered medications prior to visit.     No Known Allergies   Family History  Problem Relation Age of Onset  . Diabetes  Mother   . Hyperlipidemia Mother   . Hypertension Mother   . Hyperlipidemia Father   . Diabetes Maternal Grandmother   . Alzheimer's disease Maternal Grandmother   . Glaucoma Maternal Grandmother      Health Habits: Dental Exam: up to date Eye Exam: up to date   Social History   Socioeconomic History  . Marital status: Single    Spouse name: Not on file  . Number of children: Not on file  . Years of education: Not on file  . Highest education level: Not on file  Occupational History  . Not on file  Social Needs  . Financial resource strain: Not on file  . Food insecurity    Worry: Not on file    Inability: Not on file  . Transportation needs     Medical: Not on file    Non-medical: Not on file  Tobacco Use  . Smoking status: Never Smoker  . Smokeless tobacco: Never Used  Substance and Sexual Activity  . Alcohol use: Not Currently    Alcohol/week: 1.0 standard drinks    Types: 1 Standard drinks or equivalent per week  . Drug use: Yes    Types: Marijuana  . Sexual activity: Never  Lifestyle  . Physical activity    Days per week: Not on file    Minutes per session: Not on file  . Stress: Not on file  Relationships  . Social Herbalist on phone: Not on file    Gets together: Not on file    Attends religious service: Not on file    Active member of club or organization: Not on file    Attends meetings of clubs or organizations: Not on file    Relationship status: Not on file  . Intimate partner violence    Fear of current or ex partner: Not on file    Emotionally abused: Not on file    Physically abused: Not on file    Forced sexual activity: Not on file  Other Topics Concern  . Not on file  Social History Narrative  . Not on file   Social History   Substance and Sexual Activity  Alcohol Use Not Currently  . Alcohol/week: 1.0 standard drinks  . Types: 1 Standard drinks or equivalent per week   Social History   Tobacco Use  Smoking Status Never Smoker  Smokeless Tobacco Never Used   Social History   Substance and Sexual Activity  Drug Use Yes  . Types: Marijuana    GYN: Sexual Health Menstrual status: regular menses LMP: Patient's last menstrual period was 05/30/2019. Last pap smear: see HM section History of abnormal pap smears:    Health Maintenance: See under health Maintenance activity for review of completion dates as well. Immunization History  Administered Date(s) Administered  . Influenza,inj,Quad PF,6+ Mos 07/25/2018, 06/02/2019  . Tdap 02/16/2017      Depression Screen-PHQ2/9 Depression screen [facility] 2/9 06/02/2019 03/10/2019 12/23/2018 03/27/2018 03/27/2018  Decreased Interest 0  0 0 3 0  Down, Depressed, Hopeless 0 0 0 0 0  PHQ - 2 Score 0 0 0 3 0       Depression Severity and Treatment Recommendations:  0-4= None  5-9= Mild / Treatment: Support, educate to call if worse; return in one month  10-14= Moderate / Treatment: Support, watchful waiting; Antidepressant or Psycotherapy  15-19= Moderately severe / Treatment: Antidepressant OR Psychotherapy  >= 20 = Major depression, severe / Antidepressant AND Psychotherapy  Review of Systems   ROS  See HPI for ROS as well.   Review of Systems  Constitutional: Negative for activity change, appetite change, chills and fever.  HENT: Negative for congestion, nosebleeds, trouble swallowing and voice change.   Respiratory: Negative for cough, shortness of breath and wheezing.   Gastrointestinal: Negative for diarrhea, nausea and vomiting.  Genitourinary: Negative for difficulty urinating, dysuria, flank pain and hematuria.  Musculoskeletal: Negative for back pain, joint swelling and neck pain.  Neurological: Negative for dizziness, speech difficulty, light-headedness and numbness.  See HPI. All other review of systems negative.   Objective:   Vitals:   06/02/19 1416  BP: 109/79  Pulse: 77  Resp: 17  Temp: 98.5 F (36.9 C)  TempSrc: Oral  SpO2: 100%  Weight: 146 lb 6.4 oz (66.4 kg)  Height: 5' 6.54" (1.69 m)    Body mass index is 23.25 kg/m.  Physical Exam Constitutional:      Appearance: Normal appearance. She is normal weight.  HENT:     Head: Normocephalic and atraumatic.     Right Ear: Tympanic membrane normal.     Left Ear: Tympanic membrane normal.     Nose: Nose normal.  Neck:     Musculoskeletal: Normal range of motion.     Comments: No thyromegaly Cardiovascular:     Rate and Rhythm: Normal rate and regular rhythm.     Pulses: Normal pulses.     Heart sounds: No murmur.  Pulmonary:     Effort: Pulmonary effort is normal. No respiratory distress.     Breath sounds: Normal breath  sounds. No wheezing.  Abdominal:     General: Abdomen is flat. Bowel sounds are normal. There is no distension.     Palpations: There is no mass.     Tenderness: There is no abdominal tenderness. There is no guarding or rebound.     Hernia: No hernia is present.  Genitourinary:    Comments: With chaperon present  Area was inspected with healing folliculitis No palpable inguinal LAD Musculoskeletal: Normal range of motion.        General: Swelling present.     Comments: Tenderness over the spinal process noted in a palpable lesion the size of a 1cm x 1cm lesion  Neurological:     Mental Status: She is alert.  Psychiatric:        Mood and Affect: Mood normal.        Behavior: Behavior normal.        Thought Content: Thought content normal.        Judgment: Judgment normal.        Assessment/Plan:   Patient was seen for a health maintenance exam.  Counseled the patient on health maintenance issues. Reviewed her health mainteance schedule and ordered appropriate tests (see orders.) Counseled on regular exercise and weight management. Recommend regular eye exams and dental cleaning.   The following issues were addressed today for health maintenance:   Haide was seen today for pap and flu shot.  Diagnoses and all orders for this visit:  Encounter for health maintenance examination in adult- Women's Health Maintenance Plan Advised monthly breast exam and annual mammogram Advised dental exam every six months Discussed stress management Discussed pap smear screening guidelines  Need for prophylactic vaccination and inoculation against influenza -     Flu Vaccine QUAD 36+ mos IM  Screening for STDs (sexually transmitted diseases)-  -     RPR -     Hepatitis B  surface antibody,quantitative -     HIV antibody (with reflex) -     GC/Chlamydia probe amp ([place])not at [facility]  Pain of lumbar spine- discussed pain mgmt -     DG Lumbar Spine 2-3 Views; Future  Other orders -      clindamycin (CLEOCIN-T) 1 % lotion; Apply topically 2 (two) times daily. AS NEEDED -     hydrocortisone 2.5 % cream; Apply topically 2 (two) times daily. AS NEEDED    Return in about 3 weeks (around 06/23/2019) for PAP SMEAR.    Body mass index is 23.25 kg/m.:  Discussed the patient's BMI with patient. The BMI body mass index is 23.25 kg/m.     Future Appointments  Date Time Provider Department Center  06/25/2019  2:40 PM Doristine Bosworth, MD PCP-PCP [facility]    Patient Instructions       If you have lab work done today you will be contacted with your lab results within the next 2 weeks.  If you have not heard from [place] then please contact us. The fastest way to get your results is to register for My Chart.   IF you received an x-ray today, you will receive an invoice from [facility]. Please contact [facility] at [phone] with questions or concerns regarding your invoice.   IF you received labwork today, you will receive an invoice from [facility]. Please contact [facility] at [phone] with questions or concerns regarding your invoice.   Our billing staff will not be able to assist you with questions regarding bills from these companies.  You will be contacted with the lab results as soon as they are available. The fastest way to get your results is to activate your My Chart account. Instructions are located on the last page of this paperwork. If you have not heard from [place] regarding the results in 2 weeks, please contact this office.

## 2019-06-02 NOTE — Patient Instructions (Signed)
° ° ° °  If you have lab work done today you will be contacted with your lab results within the next 2 weeks.  If you have not heard from us then please contact us. The fastest way to get your results is to register for My Chart. ° ° °IF you received an x-ray today, you will receive an invoice from [facility]. Please contact [facility] at [phone] with questions or concerns regarding your invoice.  ° °IF you received labwork today, you will receive an invoice from [facility]. Please contact [facility] at [phone] with questions or concerns regarding your invoice.  ° °Our billing staff will not be able to assist you with questions regarding bills from these companies. ° °You will be contacted with the lab results as soon as they are available. The fastest way to get your results is to activate your My Chart account. Instructions are located on the last page of this paperwork. If you have not heard from us regarding the results in 2 weeks, please contact this office. °  ° ° ° °

## 2019-06-03 LAB — HEPATITIS B SURFACE ANTIBODY, QUANTITATIVE: Hepatitis B Surf Ab Quant: <3.1 m[IU]/mL — ABNORMAL LOW (ref >9.9)

## 2019-06-03 LAB — HIV ANTIBODY (ROUTINE TESTING W REFLEX): HIV Screen 4th Generation wRfx: NONREACTIVE

## 2019-06-03 LAB — RPR: RPR Ser Ql: NONREACTIVE

## 2019-06-04 LAB — GC/CHLAMYDIA PROBE AMP (~~LOC~~) NOT AT ARMC
Chlamydia: NEGATIVE
Neisseria Gonorrhea: NEGATIVE

## 2019-06-10 ENCOUNTER — Other Ambulatory Visit: Payer: Self-pay

## 2019-06-10 ENCOUNTER — Encounter (HOSPITAL_COMMUNITY): Payer: Self-pay | Admitting: Emergency Medicine

## 2019-06-10 ENCOUNTER — Emergency Department (HOSPITAL_COMMUNITY)
Admission: EM | Admit: 2019-06-10 | Discharge: 2019-06-10 | Discharge status: Against Medical Advice | Payer: BLUE CROSS/BLUE SHIELD | Attending: Emergency Medicine | Admitting: Emergency Medicine

## 2019-06-10 DIAGNOSIS — Z5321 Procedure and treatment not carried out due to patient leaving prior to being seen by health care provider: Secondary | ICD-10-CM | POA: Diagnosis not present

## 2019-06-10 DIAGNOSIS — R109 Unspecified abdominal pain: Secondary | ICD-10-CM | POA: Diagnosis present

## 2019-06-10 LAB — COMPREHENSIVE METABOLIC PANEL
ALT: 25 U/L (ref 0–44)
AST: 23 U/L (ref 15–41)
Albumin: 4.7 g/dL (ref 3.5–5.0)
Alkaline Phosphatase: 68 U/L (ref 38–126)
Anion gap: 12 (ref 5–15)
BUN: 13 mg/dL (ref 6–20)
CO2: 22 mmol/L (ref 22–32)
Calcium: 9.8 mg/dL (ref 8.9–10.3)
Chloride: 105 mmol/L (ref 98–111)
Creatinine, Ser: 0.67 mg/dL (ref 0.44–1.00)
GFR calc Af Amer: >60 mL/min (ref >60)
GFR calc non Af Amer: >60 mL/min (ref >60)
Glucose, Bld: 92 mg/dL (ref 70–99)
Potassium: 3.7 mmol/L (ref 3.5–5.1)
Sodium: 139 mmol/L (ref 135–145)
Total Bilirubin: 1.4 mg/dL — ABNORMAL HIGH (ref 0.3–1.2)
Total Protein: 8.4 g/dL — ABNORMAL HIGH (ref 6.5–8.1)

## 2019-06-10 LAB — CBC
HCT: 40.3 % (ref 36.0–46.0)
Hemoglobin: 12.5 g/dL (ref 12.0–15.0)
MCH: 25.7 pg — ABNORMAL LOW (ref 26.0–34.0)
MCHC: 31 g/dL (ref 30.0–36.0)
MCV: 82.8 fL (ref 80.0–100.0)
Platelets: 281 10*3/uL (ref 150–400)
RBC: 4.87 MIL/uL (ref 3.87–5.11)
RDW: 15.6 % — ABNORMAL HIGH (ref 11.5–15.5)
WBC: 19 10*3/uL — ABNORMAL HIGH (ref 4.0–10.5)
nRBC: 0 % (ref 0.0–0.2)

## 2019-06-10 LAB — LIPASE, BLOOD: Lipase: 16 U/L (ref 11–51)

## 2019-06-10 LAB — HCG, QUANTITATIVE, PREGNANCY: hCG, Beta Chain, Quant, S: <1 m[IU]/mL (ref <5)

## 2019-06-10 MED ORDER — SODIUM CHLORIDE 0.9% FLUSH: 3.0000 mL | Freq: Once | INTRAVENOUS | Status: DC

## 2019-06-10 MED ORDER — ONDANSETRON 4 MG PO TBDP
4.0000 mg | ORAL_TABLET | Freq: Once | ORAL | Status: AC | PRN
  Administered 2019-06-10: 01:00:00 4 mg via ORAL
  Filled 2019-06-10: qty 1

## 2019-06-10 NOTE — ED Triage Notes (Signed)
Pt reports abdominal pain that started yesterday with vomiting when eating anything.

## 2019-06-10 NOTE — ED Notes (Signed)
Called to triagex3, no answer- 

## 2019-06-25 ENCOUNTER — Ambulatory Visit: Payer: No Typology Code available for payment source | Admitting: Family Medicine

## 2019-06-26 ENCOUNTER — Encounter: Payer: Self-pay | Admitting: Family Medicine

## 2019-07-18 ENCOUNTER — Other Ambulatory Visit: Payer: Self-pay | Admitting: Family Medicine

## 2019-07-18 DIAGNOSIS — Z3041 Encounter for surveillance of contraceptive pills: Secondary | ICD-10-CM

## 2019-07-18 NOTE — Telephone Encounter (Signed)
Forwarding medication refill request to the clinical pool for review. 

## 2019-08-29 ENCOUNTER — Ambulatory Visit (INDEPENDENT_AMBULATORY_CARE_PROVIDER_SITE_OTHER): Payer: No Typology Code available for payment source

## 2019-08-29 ENCOUNTER — Ambulatory Visit (INDEPENDENT_AMBULATORY_CARE_PROVIDER_SITE_OTHER): Payer: No Typology Code available for payment source | Admitting: Adult Health Nurse Practitioner

## 2019-08-29 ENCOUNTER — Other Ambulatory Visit: Payer: Self-pay

## 2019-08-29 ENCOUNTER — Other Ambulatory Visit: Payer: Self-pay | Admitting: Adult Health Nurse Practitioner

## 2019-08-29 ENCOUNTER — Encounter: Payer: Self-pay | Admitting: Registered Nurse

## 2019-08-29 DIAGNOSIS — L729 Follicular cyst of the skin and subcutaneous tissue, unspecified: Secondary | ICD-10-CM

## 2019-08-29 DIAGNOSIS — M62838 Other muscle spasm: Secondary | ICD-10-CM | POA: Diagnosis not present

## 2019-08-29 DIAGNOSIS — M545 Low back pain: Secondary | ICD-10-CM | POA: Diagnosis not present

## 2019-08-29 DIAGNOSIS — M542 Cervicalgia: Secondary | ICD-10-CM | POA: Diagnosis not present

## 2019-08-29 DIAGNOSIS — Z3041 Encounter for surveillance of contraceptive pills: Secondary | ICD-10-CM

## 2019-08-29 DIAGNOSIS — M546 Pain in thoracic spine: Secondary | ICD-10-CM

## 2019-08-29 DIAGNOSIS — S134XXA Sprain of ligaments of cervical spine, initial encounter: Secondary | ICD-10-CM

## 2019-08-29 HISTORY — DX: Other muscle spasm: M62.838

## 2019-08-29 HISTORY — DX: Sprain of ligaments of cervical spine, initial encounter: S13.4XXA

## 2019-08-29 HISTORY — DX: Follicular cyst of the skin and subcutaneous tissue, unspecified: L72.9

## 2019-08-29 MED ORDER — METHYLPREDNISOLONE 4 MG PO TBPK: ORAL_TABLET | ORAL | 0 refills | Status: DC

## 2019-08-29 MED ORDER — LEVONORGESTREL-ETHINYL ESTRAD 0.1-20 MG-MCG PO TABS: 1.0000 | ORAL_TABLET | Freq: Every day | ORAL | 6 refills | Status: DC

## 2019-08-29 MED ORDER — CEPHALEXIN 500 MG PO CAPS: 500.0000 mg | ORAL_CAPSULE | Freq: Four times a day (QID) | ORAL | 0 refills | Status: AC

## 2019-08-29 MED ORDER — METHOCARBAMOL 500 MG PO TABS: 500.0000 mg | ORAL_TABLET | Freq: Four times a day (QID) | ORAL | 0 refills | Status: DC
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
## 2019-08-29 NOTE — Patient Instructions (Signed)

## 2019-08-29 NOTE — Progress Notes (Signed)
Erroneous note, there is already a note from this date

## 2019-08-29 NOTE — Progress Notes (Signed)
Sending these along for your review

## 2019-12-18 ENCOUNTER — Ambulatory Visit: Payer: BLUE CROSS/BLUE SHIELD | Attending: Family

## 2019-12-18 DIAGNOSIS — Z23 Encounter for immunization: Secondary | ICD-10-CM

## 2019-12-18 NOTE — Progress Notes (Signed)
   Covid-19 Vaccination Clinic  Name:  Sue Allen    MRN: [id number] DOB: 19-Sep-1995  12/18/2019  Sue Allen was observed post Covid-19 immunization for 15 minutes without incident. She was provided with Vaccine Information Sheet and instruction to access the V-Safe system.   Sue Allen was instructed to call 911 with any severe reactions post vaccine: Marland Kitchen Difficulty breathing  . Swelling of face and throat  . A fast heartbeat  . A bad rash all over body  . Dizziness and weakness   Immunizations Administered    Name Date Dose VIS Date Route   Moderna COVID-19 Vaccine 12/18/2019  4:04 PM 0.5 mL 08/19/2019 Intramuscular   Manufacturer: Moderna   Lot: [id number]   NDC: 80638-685-48

## 2020-01-20 ENCOUNTER — Ambulatory Visit: Payer: BLUE CROSS/BLUE SHIELD | Attending: Internal Medicine

## 2020-01-22 ENCOUNTER — Ambulatory Visit: Payer: BLUE CROSS/BLUE SHIELD | Attending: Family

## 2020-01-22 DIAGNOSIS — Z23 Encounter for immunization: Secondary | ICD-10-CM

## 2020-01-22 NOTE — Progress Notes (Signed)
   Covid-19 Vaccination Clinic  Name:  Sue Allen    MRN: [id number] DOB: 1996-09-09  01/22/2020  [name] was observed post Covid-19 immunization for 15 minutes without incident. She was provided with Vaccine Information Sheet and instruction to access the V-Safe system.   [name] was instructed to call 911 with any severe reactions post vaccine: Marland Kitchen Difficulty breathing  . Swelling of face and throat  . A fast heartbeat  . A bad rash all over body  . Dizziness and weakness   Immunizations Administered    Name Date Dose VIS Date Route   Moderna COVID-19 Vaccine 01/22/2020  3:02 PM 0.5 mL 08/2019 Intramuscular   Manufacturer: Moderna   Lot: [id number]   NDC: 28768-115-72

## 2020-07-15 ENCOUNTER — Telehealth: Payer: No Typology Code available for payment source | Admitting: Family Medicine

## 2020-07-15 ENCOUNTER — Other Ambulatory Visit: Payer: Self-pay

## 2020-07-16 ENCOUNTER — Encounter: Payer: Self-pay | Admitting: Family Medicine

## 2020-08-25 ENCOUNTER — Ambulatory Visit: Payer: No Typology Code available for payment source | Admitting: Family Medicine

## 2020-09-09 ENCOUNTER — Ambulatory Visit: Payer: No Typology Code available for payment source | Admitting: Family Medicine

## 2020-09-09 ENCOUNTER — Encounter: Payer: Self-pay | Admitting: Family Medicine

## 2020-09-09 ENCOUNTER — Other Ambulatory Visit: Payer: Self-pay

## 2020-09-09 VITALS — BP 120/65 | HR 98 | Temp 98.0°F | Resp 16 | Ht 65.0 in | Wt 146.0 lb

## 2020-09-09 DIAGNOSIS — T7840XA Allergy, unspecified, initial encounter: Secondary | ICD-10-CM | POA: Diagnosis not present

## 2020-09-09 DIAGNOSIS — Z0001 Encounter for general adult medical examination with abnormal findings: Secondary | ICD-10-CM | POA: Diagnosis not present

## 2020-09-09 DIAGNOSIS — Z3009 Encounter for other general counseling and advice on contraception: Secondary | ICD-10-CM

## 2020-09-09 DIAGNOSIS — N760 Acute vaginitis: Secondary | ICD-10-CM | POA: Diagnosis not present

## 2020-09-09 DIAGNOSIS — B9689 Other specified bacterial agents as the cause of diseases classified elsewhere: Secondary | ICD-10-CM

## 2020-09-09 DIAGNOSIS — Z0000 Encounter for general adult medical examination without abnormal findings: Secondary | ICD-10-CM

## 2020-09-09 DIAGNOSIS — Z23 Encounter for immunization: Secondary | ICD-10-CM

## 2020-09-09 LAB — POCT WET + KOH PREP
Trich by wet prep: ABSENT
Yeast by KOH: ABSENT
Yeast by wet prep: ABSENT

## 2020-09-09 MED ORDER — MEDROXYPROGESTERONE ACETATE 150 MG/ML IM SUSP: 150.0000 mg | INTRAMUSCULAR | Status: AC

## 2020-09-09 MED ORDER — FLUTICASONE PROPIONATE 50 MCG/ACT NA SUSP: 2.0000 | Freq: Every day | NASAL | 6 refills | Status: AC

## 2020-09-09 MED ORDER — METRONIDAZOLE 0.75 % VA GEL: 1.0000 | Freq: Every day | VAGINAL | 0 refills | Status: AC
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
## 2020-09-09 NOTE — Patient Instructions (Signed)
Health Maintenance, Female Adopting a healthy lifestyle and getting preventive care are important in promoting health and wellness. Ask your health care provider about:  The right schedule for you to have regular tests and exams.  Things you can do on your own to prevent diseases and keep yourself healthy. What should I know about diet, weight, and exercise? Eat a healthy diet   Eat a diet that includes plenty of vegetables, fruits, low-fat dairy products, and lean protein.  Do not eat a lot of foods that are high in solid fats, added sugars, or sodium. Maintain a healthy weight Body mass index (BMI) is used to identify weight problems. It estimates body fat based on height and weight. Your health care provider can help determine your BMI and help you achieve or maintain a healthy weight. Get regular exercise Get regular exercise. This is one of the most important things you can do for your health. Most adults should:  Exercise for at least 150 minutes each week. The exercise should increase your heart rate and make you sweat (moderate-intensity exercise).  Do strengthening exercises at least twice a week. This is in addition to the moderate-intensity exercise.  Spend less time sitting. Even light physical activity can be beneficial. Watch cholesterol and blood lipids Have your blood tested for lipids and cholesterol at 25 years of age, then have this test every 5 years. Have your cholesterol levels checked more often if:  Your lipid or cholesterol levels are high.  You are older than 25 years of age.  You are at high risk for heart disease. What should I know about cancer screening? Depending on your health history and family history, you may need to have cancer screening at various ages. This may include screening for:  Breast cancer.  Cervical cancer.  Colorectal cancer.  Skin cancer.  Lung cancer. What should I know about heart disease, diabetes, and high blood  pressure? Blood pressure and heart disease  High blood pressure causes heart disease and increases the risk of stroke. This is more likely to develop in people who have high blood pressure readings, are of African descent, or are overweight.  Have your blood pressure checked: ? Every 3-5 years if you are 18-39 years of age. ? Every year if you are 40 years old or older. Diabetes Have regular diabetes screenings. This checks your fasting blood sugar level. Have the screening done:  Once every three years after age 40 if you are at a normal weight and have a low risk for diabetes.  More often and at a younger age if you are overweight or have a high risk for diabetes. What should I know about preventing infection? Hepatitis B If you have a higher risk for hepatitis B, you should be screened for this virus. Talk with your health care provider to find out if you are at risk for hepatitis B infection. Hepatitis C Testing is recommended for:  Everyone born from 1945 through 1965.  Anyone with known risk factors for hepatitis C. Sexually transmitted infections (STIs)  Get screened for STIs, including gonorrhea and chlamydia, if: ? You are sexually active and are younger than 24 years of age. ? You are older than 24 years of age and your health care provider tells you that you are at risk for this type of infection. ? Your sexual activity has changed since you were last screened, and you are at increased risk for chlamydia or gonorrhea. Ask your health care provider if   you are at risk.  Ask your health care provider about whether you are at high risk for HIV. Your health care provider may recommend a prescription medicine to help prevent HIV infection. If you choose to take medicine to prevent HIV, you should first get tested for HIV. You should then be tested every 3 months for as long as you are taking the medicine. Pregnancy  If you are about to stop having your period (premenopausal) and  you may become pregnant, seek counseling before you get pregnant.  Take 400 to 800 micrograms (mcg) of folic acid every day if you become pregnant.  Ask for birth control (contraception) if you want to prevent pregnancy. Osteoporosis and menopause Osteoporosis is a disease in which the bones lose minerals and strength with aging. This can result in bone fractures. If you are 65 years old or older, or if you are at risk for osteoporosis and fractures, ask your health care provider if you should:  Be screened for bone loss.  Take a calcium or vitamin D supplement to lower your risk of fractures.  Be given hormone replacement therapy (HRT) to treat symptoms of menopause. Follow these instructions at home: Lifestyle  Do not use any products that contain nicotine or tobacco, such as cigarettes, e-cigarettes, and chewing tobacco. If you need help quitting, ask your health care provider.  Do not use street drugs.  Do not share needles.  Ask your health care provider for help if you need support or information about quitting drugs. Alcohol use  Do not drink alcohol if: ? Your health care provider tells you not to drink. ? You are pregnant, may be pregnant, or are planning to become pregnant.  If you drink alcohol: ? Limit how much you use to 0-1 drink a day. ? Limit intake if you are breastfeeding.  Be aware of how much alcohol is in your drink. In the [place] one drink equals one 12 oz bottle of beer (355 mL), one 5 oz glass of wine (148 mL), or one 1 oz glass of hard liquor (44 mL). General instructions  Schedule regular health, dental, and eye exams.  Stay current with your vaccines.  Tell your health care provider if: ? You often feel depressed. ? You have ever been abused or do not feel safe at home. Summary  Adopting a healthy lifestyle and getting preventive care are important in promoting health and wellness.  Follow your health care provider's instructions about healthy  diet, exercising, and getting tested or screened for diseases.  Follow your health care provider's instructions on monitoring your cholesterol and blood pressure. This information is not intended to replace advice given to you by your health care provider. Make sure you discuss any questions you have with your health care provider. Document Revised: 08/28/2018 Document Reviewed: 08/28/2018 Elsevier Patient Education  2020 Elsevier Inc.  

## 2020-09-10 LAB — CBC
Hematocrit: 38 % (ref 34.0–46.6)
Hemoglobin: 12.4 g/dL (ref 11.1–15.9)
MCH: 26.2 pg — ABNORMAL LOW (ref 26.6–33.0)
MCHC: 32.6 g/dL (ref 31.5–35.7)
MCV: 80 fL (ref 79–97)
Platelets: 214 10*3/uL (ref 150–450)
RBC: 4.74 x10E6/uL (ref 3.77–5.28)
RDW: 13.6 % (ref 11.7–15.4)
WBC: 4 10*3/uL (ref 3.4–10.8)

## 2020-09-10 LAB — VITAMIN D 25 HYDROXY (VIT D DEFICIENCY, FRACTURES): Vit D, 25-Hydroxy: 27.9 ng/mL — ABNORMAL LOW (ref 30.0–100.0)

## 2020-09-10 LAB — CMP14+EGFR
ALT: 15 IU/L (ref 0–32)
AST: 20 IU/L (ref 0–40)
Albumin/Globulin Ratio: 1.3 (ref 1.2–2.2)
Albumin: 4.4 g/dL (ref 3.9–5.0)
Alkaline Phosphatase: 73 IU/L (ref 44–121)
BUN/Creatinine Ratio: 15 (ref 9–23)
BUN: 12 mg/dL (ref 6–20)
Bilirubin Total: 0.5 mg/dL (ref 0.0–1.2)
CO2: 23 mmol/L (ref 20–29)
Calcium: 9.9 mg/dL (ref 8.7–10.2)
Chloride: 102 mmol/L (ref 96–106)
Creatinine, Ser: 0.78 mg/dL (ref 0.57–1.00)
GFR calc Af Amer: 122 mL/min/{1.73_m2} (ref >59)
GFR calc non Af Amer: 106 mL/min/{1.73_m2} (ref >59)
Globulin, Total: 3.4 g/dL (ref 1.5–4.5)
Glucose: 83 mg/dL (ref 65–99)
Potassium: 3.9 mmol/L (ref 3.5–5.2)
Sodium: 139 mmol/L (ref 134–144)
Total Protein: 7.8 g/dL (ref 6.0–8.5)

## 2020-09-10 LAB — IRON,TIBC AND FERRITIN PANEL
Ferritin: 58 ng/mL (ref 15–150)
Iron Saturation: 30 % (ref 15–55)
Iron: 95 ug/dL (ref 27–159)
Total Iron Binding Capacity: 317 ug/dL (ref 250–450)
UIBC: 222 ug/dL (ref 131–425)

## 2020-09-10 LAB — HEMOGLOBIN A1C
Est. average glucose Bld gHb Est-mCnc: 108 mg/dL
Hgb A1c MFr Bld: 5.4 % (ref 4.8–5.6)

## 2020-09-10 LAB — RPR: RPR Ser Ql: NONREACTIVE

## 2020-09-10 LAB — LIPID PANEL
Chol/HDL Ratio: 2.2 ratio (ref 0.0–4.4)
Cholesterol, Total: 125 mg/dL (ref 100–199)
HDL: 56 mg/dL (ref >39)
LDL Chol Calc (NIH): 58 mg/dL (ref 0–99)
Triglycerides: 48 mg/dL (ref 0–149)
VLDL Cholesterol Cal: 11 mg/dL (ref 5–40)

## 2020-09-10 LAB — TSH: TSH: 0.833 u[IU]/mL (ref 0.450–4.500)

## 2020-09-10 LAB — HEPATITIS C ANTIBODY: Hep C Virus Ab: <0.1 s/co ratio (ref 0.0–0.9)

## 2020-09-13 ENCOUNTER — Ambulatory Visit: Payer: No Typology Code available for payment source

## 2020-09-20 ENCOUNTER — Ambulatory Visit
Admission: EM | Admit: 2020-09-20 | Discharge: 2020-09-20 | Disposition: A | Discharge status: Home / Self Care | Payer: BLUE CROSS/BLUE SHIELD

## 2020-09-20 DIAGNOSIS — T192XXA Foreign body in vulva and vagina, initial encounter: Secondary | ICD-10-CM
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
## 2020-09-20 NOTE — ED Triage Notes (Signed)
Patient presents to Urgent Care with complaints of a tampon getting stuck since Friday.   Denies fever.
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
